# Patient Record
Sex: Female | Born: 1955 | Race: White | Hispanic: No | Marital: Married | Smoking: Never smoker
Health system: Southern US, Community
[De-identification: ages and names within clinical notes are randomized; demographics above are authoritative.]

## PROBLEM LIST (undated history)

## (undated) DIAGNOSIS — Z9889 Other specified postprocedural states: Secondary | ICD-10-CM

## (undated) DIAGNOSIS — T4145XA Adverse effect of unspecified anesthetic, initial encounter: Secondary | ICD-10-CM

## (undated) DIAGNOSIS — R112 Nausea with vomiting, unspecified: Secondary | ICD-10-CM

## (undated) DIAGNOSIS — T8859XA Other complications of anesthesia, initial encounter: Secondary | ICD-10-CM

## (undated) DIAGNOSIS — F419 Anxiety disorder, unspecified: Secondary | ICD-10-CM

## (undated) DIAGNOSIS — J45909 Unspecified asthma, uncomplicated: Secondary | ICD-10-CM

## (undated) HISTORY — DX: Adverse effect of unspecified anesthetic, initial encounter: T41.45XA

## (undated) HISTORY — DX: Other complications of anesthesia, initial encounter: T88.59XA

## (undated) HISTORY — PX: HERNIA REPAIR: SHX51

## (undated) HISTORY — DX: Unspecified asthma, uncomplicated: J45.909

---

## 2008-05-19 ENCOUNTER — Other Ambulatory Visit: Admission: RE | Admit: 2008-05-19 | Discharge: 2008-05-19 | Payer: Self-pay | Admitting: Obstetrics and Gynecology

## 2010-02-20 LAB — HM DEXA SCAN: HM Dexa Scan: NORMAL

## 2010-03-03 ENCOUNTER — Ambulatory Visit
Admission: RE | Admit: 2010-03-03 | Discharge: 2010-03-03 | Payer: Self-pay | Source: Home / Self Care | Attending: Internal Medicine | Admitting: Internal Medicine

## 2010-03-03 DIAGNOSIS — F411 Generalized anxiety disorder: Secondary | ICD-10-CM | POA: Insufficient documentation

## 2010-03-21 ENCOUNTER — Telehealth (INDEPENDENT_AMBULATORY_CARE_PROVIDER_SITE_OTHER): Payer: Self-pay | Admitting: *Deleted

## 2010-03-24 NOTE — Assessment & Plan Note (Signed)
Summary: new acute//congestion//lch   Vital Signs:  Patient profile:   55 year old female Height:      60.5 inches Weight:      122.25 pounds BMI:     23.57 Temp:     98.1 degrees F oral Pulse rate:   62 / minute Pulse rhythm:   regular BP sitting:   112 / 70  (left arm) Cuff size:   regular  Vitals Entered By: Army Fossa CMA (March 03, 2010 11:22 AM) CC: New to establsih, head and chest congestion  Comments mucus is green  started the day after christmas [facility]    History of Present Illness: new patient Symptoms started around Christmas Cough, chest and sinus congestion. Some green sputum. robitussin  helps some, Benadryl did not help  Preventive Screening-Counseling & Management  Alcohol-Tobacco     Smoking Status: never  Caffeine-Diet-Exercise     Does Patient Exercise: yes      Drug Use:  no.    Allergies (verified): 1)  ! Demerol  Past History:  Family History: Last updated: 03/03/2010 Heart disease  Family History Osteoporosis Family History of Stroke F 1st degree relative <60  Social History: Last updated: 03/03/2010 Married Never Smoked Alcohol use-no Drug use-no Regular exercise-yes  Risk Factors: Exercise: yes (03/03/2010)  Risk Factors: Smoking Status: never (03/03/2010)  Past Medical History: Anxiety BC-- husband vasectomy   Past Surgical History: L hernia repair  Csection x 2   Family History: Heart disease  Family History Osteoporosis Family History of Stroke F 1st degree relative <60  Social History: Married Never Smoked Alcohol use-no Drug use-no Regular exercise-yes Smoking Status:  never Drug Use:  no Does Patient Exercise:  yes  Review of Systems General:  Denies fever. Resp:  Denies coughing up blood, shortness of breath, and wheezing. GI:  Denies nausea and vomiting. MS:  Denies muscle aches.  Physical Exam  General:  alert, well-developed, and well-nourished.  no apparent  distress Head:  face symmetric, not tender to palpation Ears:  R ear normal and L ear normal.   Nose:  no congested Mouth:  no redness or discharge Lungs:  normal respiratory effort, no intercostal retractions, no accessory muscle use, and normal breath sounds.   Heart:  normal rate, regular rhythm, no murmur, and no gallop.     Impression & Recommendations:  Problem # 1:  BRONCHITIS- ACUTE (ICD-466.0) symptoms consistent with a URI now with cough and sputum production see instructions  Her updated medication list for this problem includes:    Zithromax Z-pak 250 Mg Tabs (Azithromycin) .Marland Kitchen... As directed  Complete Medication List: 1)  Zoloft 25 Mg Tabs (Sertraline hcl) .... Qd 2)  Zithromax Z-pak 250 Mg Tabs (Azithromycin) .... As directed  Patient Instructions: 1)  rest- fluids 2)  mucinex DM twice a day as needed for cough 3)  sudafed 30mg  behind the counter : 4 times a day as needed for congestion 4)  zpack 5)  call if no better in few days, call if symptoms worsen  Prescriptions: ZITHROMAX Z-PAK 250 MG TABS (AZITHROMYCIN) as directed  #1 x 0   Entered and Authorized by:   Nolon Rod. Joell Usman MD   Signed by:   Nolon Rod. Elzie Knisley MD on 03/03/2010   Method used:   Print then Give to Patient   RxID:   [id number]    Orders Added: 1)  New Patient Level II [99202]     Preventive Care Screening  Mammogram:  Date:  01/20/2010    Results:  normal-per pt   Pap Smear:    Date:  11/20/2009    Results:  normal-per pt   Last Tetanus Booster:    Date:  02/21/2004    Results:  Historical

## 2010-03-30 NOTE — Progress Notes (Signed)
Summary: Still having syptoms  Phone Note Call from Patient Call back at [phone]   Caller: Patient Summary of Call: Pt was given ZPak at last ov for congestion. Pt is still having symptoms. Please advise if something can be called in for her or does she need an OV. Initial call taken by: Lavell Islam,  March 21, 2010 9:05 AM  Follow-up for Phone Call        call in: Flonase--2 sprays on each side of the nose daily for a month Over-the-counter Claritin 10 mg one daily if no improving in the next 10 days ----> OV Follow-up by: Nolon Rod. Paz MD,  March 21, 2010 12:04 PM  Additional Follow-up for Phone Call Additional follow up Details #1::        pt is calling again. please contact her asap Additional Follow-up by: Lavell Islam,  March 21, 2010 1:48 PM    Additional Follow-up for Phone Call Additional follow up Details #2::    Pt aware Rx sent to pharmacy, and OVINB.............Marland KitchenFelecia Deloach CMA  March 21, 2010 2:56 PM   New/Updated Medications: FLONASE 50 MCG/ACT SUSP (FLUTICASONE PROPIONATE) 2 sprays on each side of the nose daily for a month Prescriptions: FLONASE 50 MCG/ACT SUSP (FLUTICASONE PROPIONATE) 2 sprays on each side of the nose daily for a month  #1 x 0   Entered by:   Jeremy Johann CMA   Authorized by:   Nolon Rod. Paz MD   Signed by:   Jeremy Johann CMA on 03/21/2010   Method used:   Faxed to ...       [facility] (retail)       [address]       [place], [place]  [address]       Ph: [phone]       Fax: [phone]   RxID:   [id number]

## 2011-04-13 ENCOUNTER — Encounter (HOSPITAL_COMMUNITY): Payer: Self-pay | Admitting: Pharmacist

## 2011-04-21 HISTORY — PX: ABDOMINAL HYSTERECTOMY: SHX81

## 2011-04-24 ENCOUNTER — Other Ambulatory Visit: Payer: Self-pay | Admitting: Obstetrics and Gynecology

## 2011-04-26 ENCOUNTER — Encounter (HOSPITAL_COMMUNITY): Payer: Self-pay

## 2011-04-26 ENCOUNTER — Encounter (HOSPITAL_COMMUNITY)
Admission: RE | Admit: 2011-04-26 | Discharge: 2011-04-26 | Disposition: A | Discharge status: Home / Self Care | Payer: No Typology Code available for payment source | Source: Ambulatory Visit | Attending: Obstetrics and Gynecology | Admitting: Obstetrics and Gynecology

## 2011-04-26 HISTORY — DX: Anxiety disorder, unspecified: F41.9

## 2011-04-26 HISTORY — DX: Other specified postprocedural states: Z98.890

## 2011-04-26 HISTORY — DX: Other specified postprocedural states: R11.2

## 2011-04-26 LAB — BASIC METABOLIC PANEL
BUN: 15 mg/dL (ref 6–23)
Chloride: 103 mEq/L (ref 96–112)
GFR calc Af Amer: >90 mL/min (ref >90)
Glucose, Bld: 93 mg/dL (ref 70–99)
Potassium: 4.9 mEq/L (ref 3.5–5.1)
Sodium: 140 mEq/L (ref 135–145)

## 2011-04-26 LAB — SURGICAL PCR SCREEN: MRSA, PCR: NEGATIVE

## 2011-04-26 NOTE — Patient Instructions (Signed)
YOUR PROCEDURE IS SCHEDULED ON:05/01/11  ENTER THROUGH THE MAIN ENTRANCE OF [facility] AT:11am  USE DESK PHONE AND DIAL [phone] TO INFORM us OF YOUR ARRIVAL  CALL [phone] IF YOU HAVE ANY QUESTIONS OR PROBLEMS PRIOR TO YOUR ARRIVAL.  REMEMBER: DO NOT EAT AFTER MIDNIGHT :Sunday  SPECIAL INSTRUCTIONS:clear liquids until 0830 am Monday   YOU MAY BRUSH YOUR TEETH THE MORNING OF SURGERY   TAKE THESE MEDICINES THE DAY OF SURGERY WITH SIP OF WATER:allergy med , Zoloft 05/01/11  DO NOT WEAR JEWELRY, EYE MAKEUP, LIPSTICK OR DARK FINGERNAIL POLISH DO NOT WEAR LOTIONS  DO NOT SHAVE FOR 48 HOURS PRIOR TO SURGERY  YOU WILL NOT BE ALLOWED TO DRIVE YOURSELF HOME.  NAME OF DRIVER: Sam - spouse

## 2011-04-30 MED ORDER — DEXTROSE 5 % IV SOLN
1.0000 g | INTRAVENOUS | Status: AC
  Administered 2011-05-01: 1 g via INTRAVENOUS
  Filled 2011-04-30: qty 1

## 2011-05-01 ENCOUNTER — Encounter (HOSPITAL_COMMUNITY): Payer: Self-pay | Admitting: *Deleted

## 2011-05-01 ENCOUNTER — Encounter (HOSPITAL_COMMUNITY)
Admission: RE | Disposition: A | Discharge status: Home / Self Care | Payer: Self-pay | Source: Ambulatory Visit | Attending: Obstetrics and Gynecology

## 2011-05-01 ENCOUNTER — Inpatient Hospital Stay (HOSPITAL_COMMUNITY): Payer: No Typology Code available for payment source

## 2011-05-01 ENCOUNTER — Encounter (HOSPITAL_COMMUNITY): Payer: Self-pay

## 2011-05-01 ENCOUNTER — Ambulatory Visit (HOSPITAL_COMMUNITY)
Admission: RE | Admit: 2011-05-01 | Discharge: 2011-05-02 | Disposition: A | Discharge status: Home / Self Care | Payer: No Typology Code available for payment source | Source: Ambulatory Visit | Attending: Obstetrics and Gynecology | Admitting: Obstetrics and Gynecology

## 2011-05-01 DIAGNOSIS — N950 Postmenopausal bleeding: Secondary | ICD-10-CM | POA: Insufficient documentation

## 2011-05-01 DIAGNOSIS — Z01812 Encounter for preprocedural laboratory examination: Secondary | ICD-10-CM | POA: Insufficient documentation

## 2011-05-01 DIAGNOSIS — D250 Submucous leiomyoma of uterus: Secondary | ICD-10-CM | POA: Insufficient documentation

## 2011-05-01 DIAGNOSIS — Z01818 Encounter for other preprocedural examination: Secondary | ICD-10-CM | POA: Insufficient documentation

## 2011-05-01 DIAGNOSIS — Z90710 Acquired absence of both cervix and uterus: Secondary | ICD-10-CM

## 2011-05-01 DIAGNOSIS — Z79890 Hormone replacement therapy (postmenopausal): Secondary | ICD-10-CM | POA: Insufficient documentation

## 2011-05-01 DIAGNOSIS — F411 Generalized anxiety disorder: Secondary | ICD-10-CM

## 2011-05-01 HISTORY — PX: SALPINGOOPHORECTOMY: SHX82

## 2011-05-01 LAB — CBC
MCH: 30.5 pg (ref 26.0–34.0)
MCHC: 34.5 g/dL (ref 30.0–36.0)
MCV: 88.5 fL (ref 78.0–100.0)
Platelets: 185 10*3/uL (ref 150–400)
RDW: 12.3 % (ref 11.5–15.5)

## 2011-05-01 SURGERY — ROBOTIC ASSISTED TOTAL HYSTERECTOMY
Anesthesia: General

## 2011-05-01 MED ORDER — KETOROLAC TROMETHAMINE 30 MG/ML IJ SOLN
30.0000 mg | Freq: Once | INTRAMUSCULAR | Status: AC
  Administered 2011-05-01: 30 mg via INTRAVENOUS

## 2011-05-01 MED ORDER — ONDANSETRON HCL 4 MG/2ML IJ SOLN
INTRAMUSCULAR | Status: AC
  Filled 2011-05-01: qty 2

## 2011-05-01 MED ORDER — LIDOCAINE HCL (CARDIAC) 20 MG/ML IV SOLN
INTRAVENOUS | Status: DC | PRN
  Administered 2011-05-01: 60 mg via INTRAVENOUS

## 2011-05-01 MED ORDER — DEXAMETHASONE SODIUM PHOSPHATE 10 MG/ML IJ SOLN
INTRAMUSCULAR | Status: DC | PRN
  Administered 2011-05-01: 10 mg via INTRAVENOUS

## 2011-05-01 MED ORDER — ACETAMINOPHEN 325 MG PO TABS: 325.0000 mg | ORAL_TABLET | ORAL | Status: DC | PRN

## 2011-05-01 MED ORDER — PROPOFOL 10 MG/ML IV EMUL
INTRAVENOUS | Status: AC
  Filled 2011-05-01: qty 20

## 2011-05-01 MED ORDER — GLYCOPYRROLATE 0.2 MG/ML IJ SOLN
INTRAMUSCULAR | Status: AC
  Filled 2011-05-01: qty 3

## 2011-05-01 MED ORDER — KETOROLAC TROMETHAMINE 30 MG/ML IJ SOLN
INTRAMUSCULAR | Status: AC
  Filled 2011-05-01: qty 1

## 2011-05-01 MED ORDER — NEOSTIGMINE METHYLSULFATE 1 MG/ML IJ SOLN
INTRAMUSCULAR | Status: AC
  Filled 2011-05-01: qty 10

## 2011-05-01 MED ORDER — ROCURONIUM BROMIDE 100 MG/10ML IV SOLN
INTRAVENOUS | Status: DC | PRN
  Administered 2011-05-01: 10 mg via INTRAVENOUS
  Administered 2011-05-01: 50 mg via INTRAVENOUS
  Administered 2011-05-01 (×2): 20 mg via INTRAVENOUS

## 2011-05-01 MED ORDER — IBUPROFEN 600 MG PO TABS
600.0000 mg | ORAL_TABLET | Freq: Four times a day (QID) | ORAL | Status: DC | PRN
  Administered 2011-05-02: 600 mg via ORAL
  Filled 2011-05-01: qty 1

## 2011-05-01 MED ORDER — SCOPOLAMINE 1 MG/3DAYS TD PT72
MEDICATED_PATCH | TRANSDERMAL | Status: AC
  Administered 2011-05-01: 1.5 mg via TRANSDERMAL
  Filled 2011-05-01: qty 1

## 2011-05-01 MED ORDER — FENTANYL CITRATE 0.05 MG/ML IJ SOLN
INTRAMUSCULAR | Status: DC | PRN
  Administered 2011-05-01: 100 ug via INTRAVENOUS
  Administered 2011-05-01 (×3): 50 ug via INTRAVENOUS

## 2011-05-01 MED ORDER — ROPIVACAINE HCL 5 MG/ML IJ SOLN
INTRAMUSCULAR | Status: DC | PRN
  Administered 2011-05-01: 30 mL via EPIDURAL

## 2011-05-01 MED ORDER — DEXAMETHASONE SODIUM PHOSPHATE 10 MG/ML IJ SOLN
INTRAMUSCULAR | Status: AC
  Filled 2011-05-01: qty 1

## 2011-05-01 MED ORDER — LACTATED RINGERS IV SOLN
INTRAVENOUS | Status: DC
  Administered 2011-05-01 – 2011-05-02 (×2): via INTRAVENOUS

## 2011-05-01 MED ORDER — FENTANYL CITRATE 0.05 MG/ML IJ SOLN: 25.0000 ug | INTRAMUSCULAR | Status: DC | PRN

## 2011-05-01 MED ORDER — SCOPOLAMINE 1 MG/3DAYS TD PT72
1.0000 | MEDICATED_PATCH | TRANSDERMAL | Status: DC
  Administered 2011-05-01: 1.5 mg via TRANSDERMAL

## 2011-05-01 MED ORDER — ROPIVACAINE HCL 5 MG/ML IJ SOLN
INTRAMUSCULAR | Status: AC
  Filled 2011-05-01: qty 60

## 2011-05-01 MED ORDER — MIDAZOLAM HCL 2 MG/2ML IJ SOLN
INTRAMUSCULAR | Status: AC
  Filled 2011-05-01: qty 2

## 2011-05-01 MED ORDER — OXYCODONE-ACETAMINOPHEN 5-325 MG PO TABS: 1.0000 | ORAL_TABLET | ORAL | Status: DC | PRN

## 2011-05-01 MED ORDER — EPHEDRINE SULFATE 50 MG/ML IJ SOLN
INTRAMUSCULAR | Status: DC | PRN
  Administered 2011-05-01: 5 mg via INTRAVENOUS
  Administered 2011-05-01 (×2): 10 mg via INTRAVENOUS

## 2011-05-01 MED ORDER — LIDOCAINE HCL (CARDIAC) 20 MG/ML IV SOLN
INTRAVENOUS | Status: AC
  Filled 2011-05-01: qty 5

## 2011-05-01 MED ORDER — ONDANSETRON HCL 4 MG/2ML IJ SOLN
INTRAMUSCULAR | Status: DC | PRN
  Administered 2011-05-01: 4 mg via INTRAVENOUS

## 2011-05-01 MED ORDER — PROPOFOL 10 MG/ML IV EMUL
INTRAVENOUS | Status: DC | PRN
  Administered 2011-05-01: 150 mg via INTRAVENOUS

## 2011-05-01 MED ORDER — LACTATED RINGERS IR SOLN
Status: DC | PRN
  Administered 2011-05-01: 3000 mL

## 2011-05-01 MED ORDER — ROCURONIUM BROMIDE 50 MG/5ML IV SOLN
INTRAVENOUS | Status: AC
  Filled 2011-05-01: qty 1

## 2011-05-01 MED ORDER — FENTANYL CITRATE 0.05 MG/ML IJ SOLN
INTRAMUSCULAR | Status: AC
  Filled 2011-05-01: qty 5

## 2011-05-01 MED ORDER — NEOSTIGMINE METHYLSULFATE 1 MG/ML IJ SOLN
INTRAMUSCULAR | Status: DC | PRN
  Administered 2011-05-01: 3 mg via INTRAVENOUS

## 2011-05-01 MED ORDER — ARTIFICIAL TEARS OP OINT
TOPICAL_OINTMENT | OPHTHALMIC | Status: DC | PRN
  Administered 2011-05-01: 1 via OPHTHALMIC

## 2011-05-01 MED ORDER — GLYCOPYRROLATE 0.2 MG/ML IJ SOLN
INTRAMUSCULAR | Status: DC | PRN
  Administered 2011-05-01: 0.1 mg via INTRAVENOUS
  Administered 2011-05-01: 0.6 mg via INTRAVENOUS

## 2011-05-01 MED ORDER — KETOROLAC TROMETHAMINE 30 MG/ML IJ SOLN: 15.0000 mg | Freq: Once | INTRAMUSCULAR | Status: DC | PRN

## 2011-05-01 MED ORDER — MIDAZOLAM HCL 5 MG/5ML IJ SOLN
INTRAMUSCULAR | Status: DC | PRN
  Administered 2011-05-01: 2 mg via INTRAVENOUS

## 2011-05-01 MED ORDER — HYDROMORPHONE HCL PF 1 MG/ML IJ SOLN
INTRAMUSCULAR | Status: AC
  Filled 2011-05-01: qty 1

## 2011-05-01 MED ORDER — STERILE WATER FOR IRRIGATION IR SOLN
Status: DC | PRN
  Administered 2011-05-01: 1000 mL

## 2011-05-01 MED ORDER — LACTATED RINGERS IV SOLN
INTRAVENOUS | Status: DC
  Administered 2011-05-01 (×2): via INTRAVENOUS

## 2011-05-01 MED ORDER — EPHEDRINE 5 MG/ML INJ
INTRAVENOUS | Status: AC
  Filled 2011-05-01: qty 10

## 2011-05-01 MED ORDER — ROCURONIUM BROMIDE 50 MG/5ML IV SOLN
INTRAVENOUS | Status: AC
  Filled 2011-05-01: qty 2

## 2011-05-01 MED ORDER — HYDROMORPHONE HCL PF 1 MG/ML IJ SOLN
INTRAMUSCULAR | Status: DC | PRN
  Administered 2011-05-01: 1 mg via INTRAVENOUS

## 2011-05-01 SURGICAL SUPPLY — 70 items
BAG URINE DRAINAGE (UROLOGICAL SUPPLIES) ×3 IMPLANT
BARRIER ADHS 3X4 INTERCEED (GAUZE/BANDAGES/DRESSINGS) ×3 IMPLANT
BENZOIN TINCTURE PRP APPL 2/3 (GAUZE/BANDAGES/DRESSINGS) ×3 IMPLANT
CABLE HIGH FREQUENCY MONO STRZ (ELECTRODE) ×3 IMPLANT
CATH FOLEY 3WAY  5CC 16FR (CATHETERS) ×1
CATH FOLEY 3WAY 5CC 16FR (CATHETERS) ×2 IMPLANT
CONT PATH 16OZ SNAP LID 3702 (MISCELLANEOUS) ×3 IMPLANT
COVER MAYO STAND STRL (DRAPES) ×3 IMPLANT
COVER TABLE BACK 60X90 (DRAPES) ×6 IMPLANT
COVER TIP SHEARS 8 DVNC (MISCELLANEOUS) ×2 IMPLANT
COVER TIP SHEARS 8MM DA VINCI (MISCELLANEOUS) ×1
DECANTER SPIKE VIAL GLASS SM (MISCELLANEOUS) ×3 IMPLANT
DRAPE HUG U DISPOSABLE (DRAPE) ×3 IMPLANT
DRAPE LG THREE QUARTER DISP (DRAPES) ×6 IMPLANT
DRAPE MONITOR DA VINCI (DRAPE) IMPLANT
DRAPE WARM FLUID 44X44 (DRAPE) ×3 IMPLANT
ELECT REM PT RETURN 9FT ADLT (ELECTROSURGICAL) ×3
ELECTRODE REM PT RTRN 9FT ADLT (ELECTROSURGICAL) ×2 IMPLANT
EVACUATOR SMOKE 8.L (FILTER) ×3 IMPLANT
GAUZE VASELINE 3X9 (GAUZE/BANDAGES/DRESSINGS) IMPLANT
GLOVE BIO SURGEON STRL SZ 6.5 (GLOVE) ×9 IMPLANT
GLOVE BIOGEL M 6.5 STRL (GLOVE) ×6 IMPLANT
GLOVE BIOGEL PI IND STRL 6.5 (GLOVE) ×4 IMPLANT
GLOVE BIOGEL PI IND STRL 7.0 (GLOVE) ×8 IMPLANT
GLOVE BIOGEL PI INDICATOR 6.5 (GLOVE) ×2
GLOVE BIOGEL PI INDICATOR 7.0 (GLOVE) ×4
GLOVE ECLIPSE 6.5 STRL STRAW (GLOVE) ×12 IMPLANT
GOWN STRL REIN XL XLG (GOWN DISPOSABLE) ×18 IMPLANT
GYRUS RUMI II 2.5CM BLUE (DISPOSABLE)
GYRUS RUMI II 3.5CM BLUE (DISPOSABLE)
GYRUS RUMI II 4.0CM BLUE (DISPOSABLE)
KIT ACCESSORY DA VINCI DISP (KITS) ×1
KIT ACCESSORY DVNC DISP (KITS) ×2 IMPLANT
KIT DISP ACCESSORY 4 ARM (KITS) IMPLANT
NEEDLE INSUFFLATION 14GA 120MM (NEEDLE) ×3 IMPLANT
NS IRRIG 1000ML POUR BTL (IV SOLUTION) ×9 IMPLANT
OCCLUDER COLPOPNEUMO (BALLOONS) IMPLANT
PACK LAVH (CUSTOM PROCEDURE TRAY) ×3 IMPLANT
PAD PREP 24X48 CUFFED NSTRL (MISCELLANEOUS) ×6 IMPLANT
PLUG CATH AND CAP STER (CATHETERS) ×3 IMPLANT
PROTECTOR NERVE ULNAR (MISCELLANEOUS) ×6 IMPLANT
RUMI II 3.0CM BLUE KOH-EFFICIE (DISPOSABLE) IMPLANT
RUMI II GYRUS 2.5CM BLUE (DISPOSABLE) IMPLANT
RUMI II GYRUS 3.5CM BLUE (DISPOSABLE) IMPLANT
RUMI II GYRUS 4.0CM BLUE (DISPOSABLE) IMPLANT
SET CYSTO W/LG BORE CLAMP LF (SET/KITS/TRAYS/PACK) ×3 IMPLANT
SET IRRIG TUBING LAPAROSCOPIC (IRRIGATION / IRRIGATOR) ×3 IMPLANT
SOLUTION ELECTROLUBE (MISCELLANEOUS) ×3 IMPLANT
SPONGE LAP 18X18 X RAY DECT (DISPOSABLE) IMPLANT
STRIP CLOSURE SKIN 1/4X4 (GAUZE/BANDAGES/DRESSINGS) ×3 IMPLANT
SUT VIC AB 0 CT1 27 (SUTURE) ×6
SUT VIC AB 0 CT1 27XBRD ANBCTR (SUTURE) ×2 IMPLANT
SUT VIC AB 0 CT1 27XBRD ANTBC (SUTURE) ×10 IMPLANT
SUT VICRYL 0 UR6 27IN ABS (SUTURE) ×3 IMPLANT
SUT VICRYL RAPIDE 4/0 PS 2 (SUTURE) ×15 IMPLANT
SYR 30ML LL (SYRINGE) ×3 IMPLANT
SYR 50ML LL SCALE MARK (SYRINGE) ×3 IMPLANT
SYSTEM CONVERTIBLE TROCAR (TROCAR) IMPLANT
TIP UTERINE 5.1X6CM LAV DISP (MISCELLANEOUS) IMPLANT
TIP UTERINE 6.7X10CM GRN DISP (MISCELLANEOUS) IMPLANT
TIP UTERINE 6.7X6CM WHT DISP (MISCELLANEOUS) IMPLANT
TIP UTERINE 6.7X8CM BLUE DISP (MISCELLANEOUS) IMPLANT
TOWEL OR 17X24 6PK STRL BLUE (TOWEL DISPOSABLE) ×9 IMPLANT
TROCAR 12M 150ML BLUNT (TROCAR) IMPLANT
TROCAR DISP BLADELESS 8 DVNC (TROCAR) ×2 IMPLANT
TROCAR DISP BLADELESS 8MM (TROCAR) ×1
TROCAR XCEL 12X100 BLDLESS (ENDOMECHANICALS) IMPLANT
TROCAR XCEL NON-BLD 5MMX100MML (ENDOMECHANICALS) ×3 IMPLANT
TUBING FILTER THERMOFLATOR (ELECTROSURGICAL) ×3 IMPLANT
WARMER LAPAROSCOPE (MISCELLANEOUS) ×3 IMPLANT

## 2011-05-01 NOTE — Transfer of Care (Signed)
Immediate Anesthesia Transfer of Care Note  Patient: Tammy Long  Procedure(s) Performed: Procedure(s) (LRB): ROBOTIC ASSISTED TOTAL HYSTERECTOMY (N/A) SALPINGO OOPHERECTOMY (Bilateral)  Patient Location: PACU  Anesthesia Type: General  Level of Consciousness: awake, alert  and oriented  Airway & Oxygen Therapy: Patient Spontanous Breathing and Patient connected to nasal cannula oxygen  Post-op Assessment: Report given to PACU RN and Post -op Vital signs reviewed and stable  Post vital signs: Reviewed and stable  Complications: No apparent anesthesia complications

## 2011-05-01 NOTE — Anesthesia Postprocedure Evaluation (Signed)
Anesthesia Post Note  Patient: Tammy Long  Procedure(s) Performed: Procedure(s) (LRB): ROBOTIC ASSISTED TOTAL HYSTERECTOMY (N/A) SALPINGO OOPHERECTOMY (Bilateral)  Anesthesia type: GA  Patient location: PACU  Post pain: Pain level controlled  Post assessment: Post-op Vital signs reviewed  Last Vitals:  Filed Vitals:   05/01/11 1745  BP: 117/55  Pulse: 97  Temp:   Resp: 5    Post vital signs: Reviewed  Level of consciousness: sedated  Complications: No apparent anesthesia complications

## 2011-05-01 NOTE — H&P (Signed)
Date of Initial H&P: 04/04/2011  History reviewed, patient examined, no change in status, stable for surgery.

## 2011-05-01 NOTE — Anesthesia Procedure Notes (Signed)
Procedure Name: Intubation Date/Time: 05/01/2011 12:56 PM Performed by: Graciela Husbands Pre-anesthesia Checklist: Suction available, Emergency Drugs available, Timeout performed, Patient identified and Patient being monitored Patient Re-evaluated:Patient Re-evaluated prior to inductionOxygen Delivery Method: Circle system utilized Preoxygenation: Pre-oxygenation with 100% oxygen Intubation Type: IV induction Ventilation: Mask ventilation without difficulty Laryngoscope Size: Mac and 3 Grade View: Grade I Tube type: Oral Tube size: 7.0 mm Number of attempts: 1 Airway Equipment and Method: Stylet Placement Confirmation: ETT inserted through vocal cords under direct vision,  breath sounds checked- equal and bilateral and positive ETCO2 Secured at: 21 cm Tube secured with: Tape Dental Injury: Teeth and Oropharynx as per pre-operative assessment

## 2011-05-01 NOTE — Anesthesia Preprocedure Evaluation (Signed)
Anesthesia Evaluation  Patient identified by MRN, date of birth, ID band Patient awake    Reviewed: Allergy & Precautions, H&P , Patient's Chart, lab work & pertinent test results, reviewed documented beta blocker date and time   History of Anesthesia Complications (+) PONV  Airway Mallampati: II TM Distance: >3 FB Neck ROM: full    Dental No notable dental hx.    Pulmonary neg pulmonary ROS,  breath sounds clear to auscultation  Pulmonary exam normal       Cardiovascular Exercise Tolerance: Good negative cardio ROS  Rhythm:regular Rate:Normal     Neuro/Psych PSYCHIATRIC DISORDERS Anxiety negative neurological ROS  negative psych ROS   GI/Hepatic negative GI ROS, Neg liver ROS,   Endo/Other  negative endocrine ROS  Renal/GU negative Renal ROS     Musculoskeletal   Abdominal   Peds  Hematology negative hematology ROS (+)   Anesthesia Other Findings   Reproductive/Obstetrics negative OB ROS                           Anesthesia Physical Anesthesia Plan  ASA: II  Anesthesia Plan: General ETT   Post-op Pain Management:    Induction:   Airway Management Planned:   Additional Equipment:   Intra-op Plan:   Post-operative Plan:   Informed Consent: I have reviewed the patients History and Physical, chart, labs and discussed the procedure including the risks, benefits and alternatives for the proposed anesthesia with the patient or authorized representative who has indicated his/her understanding and acceptance.   Dental Advisory Given  Plan Discussed with: CRNA and Surgeon  Anesthesia Plan Comments:         Anesthesia Quick Evaluation

## 2011-05-01 NOTE — Op Note (Signed)
05/01/2011  5:56 PM  PATIENT:  Tammy Long  56 y.o. female  PRE-OPERATIVE DIAGNOSIS:  Postmenopausal Bleeding; on Hormone Replacement Therapy; Submucosal Fibroid  POST-OPERATIVE DIAGNOSIS:  Postmenopausal Bleeding; on Hormone Replacement Therapy; Submucosal Fibroid  PROCEDURE:  Procedure(s) (LRB): ROBOTIC ASSISTED TOTAL HYSTERECTOMY (N/A) SALPINGO OOPHERECTOMY (Bilateral)  SURGEON:  Surgeon(s) and Role:    * Khalfani Weideman J. Richardson Dopp, MD - Primary    * Geryl Rankins, MD - Assisting    * Alison Murray, MD  PHYSICIAN ASSISTANT:   ASSISTANTS: Dr. Geryl Rankins    ANESTHESIA:   general  EBL: 100   Total I/O In: 2000 [I.V.:2000] Out: 400 [Urine:300; Blood:100]  BLOOD ADMINISTERED:none  DRAINS: Urinary Catheter (Foley)   LOCAL MEDICATIONS USED:  OTHER ropivacaine protocol  Findings: 10 wk size uterus .Marland Kitchen Dense adhesions of the bladder to the lower uterine segment and cervix. Normal fallopian tubs and ovaries. Adhesion of the omentum to the anterior abdominal wall.   SPECIMEN:  Source of Specimen:  Uterus Cervix  bilateral fallopian tubes and ovaries   DISPOSITION OF SPECIMEN:  PATHOLOGY  COUNTS:  YES  TOURNIQUET:  * No tourniquets in log *  DICTATION: .Other Dictation: Dictation Number   PLAN OF CARE: Admit for overnight observation  PATIENT DISPOSITION:  PACU - hemodynamically stable.   Delay start of Pharmacological VTE agent (>24hrs) due to surgical blood loss or risk of bleeding: not applicable   Procedure: the patient was taken to the operating room where general anesthesia was obtained. The patient was then examined under anesthesia and found to have a 10 wk size uterus. She was placed in the dorsal lithotomy position and prepped and draped in a sterile fashion. A weighted speculum was placed into the vagina. A dever was placed anteriorly for retraction. The anterior lip of the cervix was grasped with a single tooth tenaculum. The vagina mucosa was injected with 2.5  ml of Ropivacaine at the 10, 2, 4 and 8 o'clock positions. The uterus was sounded to 9 cm. The cervix was dilated to 6 mm. A suture was placed at the 12 and 6 o'clock positions of the cervix to facilitate placement of the RUMI uterine  manipulator. The Rumi manipulator was placed without difficulty. The weighted speculum and dever were removed. The vaginal occluder was insufflated.    Attention was turned to the patients abdomen where a 12 mm skin incision was made approximately 2 cm above the umbilicus. The veres needle was carefully introduced into the peritoneal cavity while tenting the abdominal wall. Intraperitoneal placement was confirmed by use of a water-filled syringe and drop in intraabdominal pressure with insufflation of CO2 gas. The veres needle was removed and a 12 mm trocar was advanced into the abdomen Intraabdominal placement was confirmed with the laparoscope. The laparoscope was removed and 60 cc of Ropivacaine was injected into the abdominal cavity. The laparoscope was then reinserted. At this point all trocar sites were determined , marked,  Injected with 10 cc of ropivacaine and placed under direct visualization. . A 12 mm skin incision was made in the upper left quadrant at the mid clavicular position.  A 12 mm accessory port was placed in the upper left quadrant at the mid clavicular position under direct visualization. An 8 mm trocar was placed in the later left upper quadrant. And later connected to robotic arm #2. Attention was turned to the right upper quadrant where an 8 mm mid clavicular trocar (later connected to  robotic Arm #1) was placed.  Once all ports were placed under direct visualization, the laparoscope was removed and  the DaVinci Robotic system was then right side docked.The arms were connected to the corresponding trocars as listed above. The laparoscope was then reinserted. The PK bipolar cautery was placed into port #1. The Monopolar scissors were placed into port #2.  All instruments were directed into the pelvis under direct visualization.    Attention was turned to the surgeons console. An adhesion of the omentum to the anterior abdominal wall was excised using monopolar scissors. The ureters were identified bilaterally. The right infundibulopelvic ligament was cauterized with the PK and excised with scissors. The broad ligament was cauterized with the PK and excised with scissors. The round ligament was cauterized with the PK and excised with scissors. Dense adhesions of the bladder to the uterus and cervix were noted.  The  anterior leaf of the broad ligament was carefully  incised along the bladder reflection to the midline.    The left infundibulopelvic  ligament was cauterized with the PK and excised with scissors. The left broad ligament was cauterized with the PK and excised with scissors. The left  round ligament was cauterized with the PK and excised with scissors. The anterior leaf of the broad ligament was incised along the bladder reflection to the midline. The bladder was filled with 300 cc of saline to facilitate demarcation of an appropriate plane. The bladder was dissected off the lower uterine segment and the cervix via sharp and blunt dissection.   The uterine arteries were skeltonized bilaterally. They were cauterized with the PK and transected. The KOH ring was  Identified. The anterior colpotomy was performed followed by the posterior colpotomy. The uterus and specimen were withdrawn into the vagina and sent to pathology. The PK and scissors were removed. Long tip forceps were placed into port #1 and cutting needle driver was placed into port #2. The vaginal cuff angles were closed with figure of eight stitches of 0 vicryl. The remainder of the vaginal cuff was closed interrupted 0 vicryl figure- of -eight sutures. The pelvis was irrigated using the accessory port. Excellent hemostasis was noted.    All pedicles were examined and hemostasis was  noted. The ureters were identified bilaterally and noted to periostalis. Interceed was placed along the vaginal cuff. All instruments were removed from there ports. All ports were removed under direct visualization.The pneumopertonium was released.  The fascia of the 12 mm ports were closed with 0 vicryl. The skin incisions were closed with 4-0 vicryl  And derma bond.   Sponge lap and needle counts were correct x 2. The patient was awakened from anesthesia and taken to the recovery room in stable condition.

## 2011-05-02 ENCOUNTER — Encounter (HOSPITAL_COMMUNITY): Payer: Self-pay | Admitting: Anesthesiology

## 2011-05-02 ENCOUNTER — Encounter (HOSPITAL_COMMUNITY): Payer: Self-pay | Admitting: Obstetrics and Gynecology

## 2011-05-02 LAB — CBC
MCH: 30.3 pg (ref 26.0–34.0)
MCHC: 34 g/dL (ref 30.0–36.0)
MCV: 89.2 fL (ref 78.0–100.0)
Platelets: 160 10*3/uL (ref 150–400)
RDW: 12.5 % (ref 11.5–15.5)

## 2011-05-02 MED ORDER — OXYCODONE-ACETAMINOPHEN 5-325 MG PO TABS: 1.0000 | ORAL_TABLET | ORAL | Status: AC | PRN

## 2011-05-02 MED ORDER — IBUPROFEN 600 MG PO TABS: 600.0000 mg | ORAL_TABLET | Freq: Four times a day (QID) | ORAL | Status: AC | PRN

## 2011-05-02 NOTE — Progress Notes (Signed)
Subjective: Patient reports no problems voiding.    Objective: I have reviewed patient's vital signs, intake and output, medications and labs.  General: alert and cooperative GI: soft, non-tender; bowel sounds normal; no masses,  no organomegaly Extremities: extremities normal, atraumatic, no cyanosis or edema   Assessment/Plan: POD #1 s/p Robotic assisted total hysterectomy /BSO Doing well. D/c home follow up in office in 2 wks   LOS: 1 day    Ceriah Long J. 05/02/2011, 9:23 AM

## 2011-05-02 NOTE — Anesthesia Postprocedure Evaluation (Signed)
  Anesthesia Post-op Note  Patient: Tammy Long  Procedure(s) Performed: Procedure(s) (LRB): ROBOTIC ASSISTED TOTAL HYSTERECTOMY (N/A) SALPINGO OOPHERECTOMY (Bilateral)  Patient Location: PACU and Women's Unit  Anesthesia Type: General  Level of Consciousness: awake, alert  and oriented  Airway and Oxygen Therapy: Patient Spontanous Breathing  Post-op Pain: mild  Post-op Assessment: Post-op Vital signs reviewed  Post-op Vital Signs: Reviewed and stable  Complications: No apparent anesthesia complications

## 2012-03-07 ENCOUNTER — Telehealth: Payer: Self-pay | Admitting: Internal Medicine

## 2012-03-07 ENCOUNTER — Encounter: Payer: Self-pay | Admitting: Family Medicine

## 2012-03-07 ENCOUNTER — Ambulatory Visit (INDEPENDENT_AMBULATORY_CARE_PROVIDER_SITE_OTHER): Payer: No Typology Code available for payment source | Admitting: Family Medicine

## 2012-03-07 VITALS — BP 110/74 | HR 69 | Temp 98.4°F | Wt 127.8 lb

## 2012-03-07 DIAGNOSIS — R109 Unspecified abdominal pain: Secondary | ICD-10-CM

## 2012-03-07 DIAGNOSIS — F3289 Other specified depressive episodes: Secondary | ICD-10-CM

## 2012-03-07 DIAGNOSIS — F32A Depression, unspecified: Secondary | ICD-10-CM

## 2012-03-07 DIAGNOSIS — F329 Major depressive disorder, single episode, unspecified: Secondary | ICD-10-CM

## 2012-03-07 LAB — POCT URINALYSIS DIPSTICK
Bilirubin, UA: NEGATIVE
Blood, UA: NEGATIVE
Nitrite, UA: NEGATIVE
Protein, UA: NEGATIVE
Urobilinogen, UA: 0.2
pH, UA: 5

## 2012-03-07 MED ORDER — CYCLOBENZAPRINE HCL 10 MG PO TABS: 10.0000 mg | ORAL_TABLET | Freq: Three times a day (TID) | ORAL | Status: DC | PRN

## 2012-03-07 MED ORDER — SERTRALINE HCL 50 MG PO TABS: 50.0000 mg | ORAL_TABLET | Freq: Every day | ORAL | Status: DC

## 2012-03-07 NOTE — Progress Notes (Signed)
  Subjective:    Tammy Long is a 57 y.o. female who presents for evaluation of low back pain. The patient has had no prior back problems. Symptoms have been present for 1 year and are gradually worsening.  Onset was related to / precipitated by no known injury. The pain is located in the R flank and does not radiate. The pain is described as sharp and occurs on and off all day. She rates her pain as a 7 on a scale of 0-10. Symptoms are exacerbated by twisting. Symptoms are improved by change in body position and rest. She has also tried nothing which provided no symptom relief. She has no other symptoms associated with the back pain. The patient has no "red flag" history indicative of complicated back pain.  Pt states it feels like there is a baseball in her R side.     The following portions of the patient's history were reviewed and updated as appropriate: allergies, current medications, past family history, past medical history, past social history, past surgical history and problem list.  Review of Systems Pertinent items are noted in HPI.    Objective:   Normal reflexes, gait, strength and negative straight-leg raise. Inspection and palpation: inspection of back is normal, pain in R flank area. Muscle tone and ROM exam: muscle tone normal without spasm.    Assessment:    r flank pain   depression-- refill zoloft Plan:    Ice to affected area as needed for local pain relief. Heat to affected area as needed for local pain relief. Muscle relaxants per medication orders. xray  rto prn

## 2012-03-07 NOTE — Patient Instructions (Addendum)
Flank Pain  Flank pain refers to pain that is located on the side of the body between the upper abdomen and the back. It can be caused by many things.  CAUSES   Some of the more common causes of flank pain include:   Muscle strain.   Muscle spasms.   A disease of your spine (vertebral disk disease).   A lung infection (pneumonia).   Fluid around your lungs (pulmonary edema).   A kidney infection.   Kidney stones.   A very painful skin rash on only one side of your body (shingles).   Gallbladder disease.  DIAGNOSIS   Blood tests, urine tests, and X-rays may help your caregiver determine what is wrong.  TREATMENT   The treatment of pain depends on the cause. Your caregiver will determine what treatment will work best for you.  HOME CARE INSTRUCTIONS    Home care will depend on the cause of your pain.   Some medications may help relieve the pain. Take medication for relief of pain as directed by your caregiver.   Tell your caregiver about any changes in your pain.   Follow up with your caregiver.  SEEK IMMEDIATE MEDICAL CARE IF:    Your pain is not controlled with medication.   The pain increases.   You have abdominal pain.   You have shortness of breath.   You have persistent nausea or vomiting.   You have swelling in your abdomen.   You feel faint or pass out.   You have a temperature by mouth above 102 F (38.9 C), not controlled by medicine.  MAKE SURE YOU:    Understand these instructions.   Will watch your condition.   Will get help right away if you are not doing well or get worse.  Document Released: 03/30/2005 Document Revised: 05/01/2011 Document Reviewed: 07/24/2009  Exit[employer] Patient Information 2013 [employer].

## 2012-03-07 NOTE — Telephone Encounter (Signed)
Patient Information:  Caller Name: Kimorah  Phone: [phone]  Patient: Tammy Long  Gender: Female  DOB: November 12, 1955  Age: 57 Years  PCP: Willow Ora  Pregnant: No  Office Follow Up:  Does the office need to follow up with this patient?: No  Instructions For The Office: N/A  RN Note:  Has had the intermittent pains for over a year but has intensified.  Today she had a sharp stabbing pain while doing yoga.  Symptoms  Reason For Call & Symptoms: Having intermittent pain on right side, just above her waist and to the back.  Seems more intense when she is rolling to her left.  Reviewed Health History In EMR: Yes  Reviewed Medications In EMR: Yes  Reviewed Allergies In EMR: Yes  Reviewed Surgeries / Procedures: Yes  Date of Onset of Symptoms: 02/21/2011 OB / GYN:  LMP: Unknown  Guideline(s) Used:  Abdominal Pain - Female  Disposition Per Guideline:   See Today in Office  Reason For Disposition Reached:   Moderate or mild pain that comes and goes (cramps) lasts > 24 hours  Advice Given:  N/A  Appointment Scheduled:  03/07/2012 15:30:00 Appointment Scheduled Provider:  Lelon Perla.

## 2012-03-07 NOTE — Telephone Encounter (Signed)
To see Dr Laury Axon today

## 2012-03-08 ENCOUNTER — Ambulatory Visit (HOSPITAL_BASED_OUTPATIENT_CLINIC_OR_DEPARTMENT_OTHER)
Admission: RE | Admit: 2012-03-08 | Discharge: 2012-03-08 | Disposition: A | Discharge status: Home / Self Care | Payer: No Typology Code available for payment source | Source: Ambulatory Visit | Attending: Family Medicine | Admitting: Family Medicine

## 2012-03-08 DIAGNOSIS — M549 Dorsalgia, unspecified: Secondary | ICD-10-CM | POA: Insufficient documentation

## 2012-03-08 DIAGNOSIS — R109 Unspecified abdominal pain: Secondary | ICD-10-CM

## 2012-04-10 ENCOUNTER — Telehealth: Payer: Self-pay | Admitting: Internal Medicine

## 2012-04-10 ENCOUNTER — Emergency Department (HOSPITAL_BASED_OUTPATIENT_CLINIC_OR_DEPARTMENT_OTHER): Payer: No Typology Code available for payment source

## 2012-04-10 ENCOUNTER — Encounter (HOSPITAL_BASED_OUTPATIENT_CLINIC_OR_DEPARTMENT_OTHER): Payer: Self-pay | Admitting: *Deleted

## 2012-04-10 ENCOUNTER — Emergency Department (HOSPITAL_BASED_OUTPATIENT_CLINIC_OR_DEPARTMENT_OTHER)
Admission: EM | Admit: 2012-04-10 | Discharge: 2012-04-10 | Disposition: A | Discharge status: Home / Self Care | Payer: No Typology Code available for payment source | Attending: Emergency Medicine | Admitting: Emergency Medicine

## 2012-04-10 DIAGNOSIS — J45901 Unspecified asthma with (acute) exacerbation: Secondary | ICD-10-CM | POA: Insufficient documentation

## 2012-04-10 DIAGNOSIS — Z79899 Other long term (current) drug therapy: Secondary | ICD-10-CM | POA: Insufficient documentation

## 2012-04-10 DIAGNOSIS — F411 Generalized anxiety disorder: Secondary | ICD-10-CM | POA: Insufficient documentation

## 2012-04-10 MED ORDER — IPRATROPIUM BROMIDE 0.02 % IN SOLN
0.5000 mg | Freq: Once | RESPIRATORY_TRACT | Status: AC
  Administered 2012-04-10: 0.5 mg via RESPIRATORY_TRACT
  Filled 2012-04-10: qty 2.5

## 2012-04-10 MED ORDER — ALBUTEROL SULFATE (5 MG/ML) 0.5% IN NEBU
5.0000 mg | INHALATION_SOLUTION | Freq: Once | RESPIRATORY_TRACT | Status: AC
  Administered 2012-04-10: 5 mg via RESPIRATORY_TRACT
  Filled 2012-04-10: qty 1

## 2012-04-10 MED ORDER — PREDNISONE 50 MG PO TABS: 50.0000 mg | ORAL_TABLET | Freq: Every day | ORAL | Status: DC

## 2012-04-10 MED ORDER — ALBUTEROL SULFATE HFA 108 (90 BASE) MCG/ACT IN AERS
2.0000 | INHALATION_SPRAY | RESPIRATORY_TRACT | Status: DC | PRN
  Filled 2012-04-10: qty 6.7

## 2012-04-10 MED ORDER — ALBUTEROL SULFATE HFA 108 (90 BASE) MCG/ACT IN AERS: 2.0000 | INHALATION_SPRAY | RESPIRATORY_TRACT | Status: DC | PRN

## 2012-04-10 MED ORDER — PREDNISONE 10 MG PO TABS
60.0000 mg | ORAL_TABLET | Freq: Once | ORAL | Status: AC
  Administered 2012-04-10: 60 mg via ORAL
  Filled 2012-04-10: qty 1

## 2012-04-10 NOTE — ED Provider Notes (Signed)
History     CSN: [id number]  Arrival date & time 04/10/12  1624   First MD Initiated Contact with Patient 04/10/12 1739      Chief Complaint  Patient presents with  . Shortness of Breath    (Consider location/radiation/quality/duration/timing/severity/associated sxs/prior treatment) HPI Pt presenting with c/o shortness of breath and cough.  She has a hx of asthma but has not had an asthma flare in many years.  For the past several days has had tight spasmodic cough and difficulty breathing.  No chest pain.  No fever/chills.  Has tried mucinex and decongestants without much relief.  Today sob became worse prompting ED visit.  Symptoms are constant.  There are no other associated systemic symptoms, there are no other alleviating or modifying factors.   Past Medical History  Diagnosis Date  . Anxiety   . PONV (postoperative nausea and vomiting)     Past Surgical History  Procedure Laterality Date  . Cesarean section      x 2  . Hernia repair    . Salpingoophorectomy  05/01/2011    Procedure: SALPINGO OOPHERECTOMY;  Surgeon: Dorien Chihuahua. Richardson Dopp, MD;  Location: [facility];  Service: Gynecology;  Laterality: Bilateral;  . Abdominal hysterectomy  04/2011    History reviewed. No pertinent family history.  History  Substance Use Topics  . Smoking status: Never Smoker   . Smokeless tobacco: Not on file  . Alcohol Use: No    OB History   Grav Para Term Preterm Abortions TAB SAB Ect Mult Living                  Review of Systems ROS reviewed and all otherwise negative except for mentioned in HPI  Allergies  Meperidine hcl  Home Medications   Current Outpatient Rx  Name  Route  Sig  Dispense  Refill  . acetaminophen (TYLENOL) 500 MG tablet   Oral   Take 500 mg by mouth as needed. Headache or pain         . albuterol (PROVENTIL HFA;VENTOLIN HFA) 108 (90 BASE) MCG/ACT inhaler   Inhalation   Inhale 2 puffs into the lungs every 4 (four) hours as needed for wheezing.   1  Inhaler   0   . Calcium-Vitamin D-Vitamin K (CALCIUM + D + K PO)   Oral   Take 1 tablet by mouth daily. Calcium, Vit D, Vit K  500mg /200units/38mcg         . cetirizine (ZYRTEC) 10 MG tablet   Oral   Take 10 mg by mouth daily.         . cyclobenzaprine (FLEXERIL) 10 MG tablet   Oral   Take 1 tablet (10 mg total) by mouth 3 (three) times daily as needed for muscle spasms.   30 tablet   0   . predniSONE (DELTASONE) 50 MG tablet   Oral   Take 1 tablet (50 mg total) by mouth daily.   4 tablet   0   . sertraline (ZOLOFT) 50 MG tablet   Oral   Take 1 tablet (50 mg total) by mouth daily.   30 tablet   11     BP 114/66  Pulse 93  Temp(Src) 98.2 F (36.8 C) (Oral)  Resp 20  Ht 5' (1.524 m)  Wt 122 lb (55.339 kg)  BMI 23.83 kg/m2  SpO2 100%  LMP 04/03/2011 Vitals reviewed Physical Exam Physical Examination: General appearance - alert, well appearing, and in no distress Mental status -  alert, oriented to person, place, and time Eyes - no scleral icterus, no conjunctival injection Mouth - mucous membranes moist, pharynx normal without lesions Chest - no rales or rhonchi, symmetric air entry, diffuse course wheezing with good air movement Heart - normal rate, regular rhythm, normal S1, S2, no murmurs, rubs, clicks or gallops Abdomen - soft, nontender, nondistended, no masses or organomegaly Extremities - peripheral pulses normal, no pedal edema, no clubbing or cyanosis Skin - normal coloration and turgor, no rashes  ED Course  Procedures (including critical care time)  Labs Reviewed - No data to display Dg Chest 2 View  04/10/2012  *RADIOLOGY REPORT*  Clinical Data: Shortness of breath.  CHEST - 2 VIEW  Comparison: 03/08/2012.  Findings: The cardiac silhouette, mediastinal and hilar contours are normal and stable.  The lungs are clear.  Mild hyperinflation. No pleural effusion.  The bony thorax is intact.  IMPRESSION: Mild hyperinflation but no infiltrates, edema or  effusions.   Original Report Authenticated By: Rudie Meyer, M.D.      1. Asthma exacerbation       MDM  Pt feels much improved after albuterol neb in ED.  Given albuterol MDI, CXR reassuring- images reviewed and interpreted by me as well.  Pt also started on prednisone.  Discharged with strict return precautions.  Pt agreeable with plan.        Ethelda Chick, MD 04/11/12 [phone]

## 2012-04-10 NOTE — Telephone Encounter (Signed)
Patient Information:  Caller Name: Tammy Long  Phone: [phone]  Patient: Tammy Long, Tammy Long  Gender: Female  DOB: [birth year]  Age: 57 Years  PCP: Tammy Long  Pregnant: No  Office Follow Up:  Does the office need to follow up with this patient?: No  Instructions For The Office: N/A  RN Note:  RN could hear the wheeze over the phone; pt does not have an inhaler available.  Symptoms  Reason For Call & Symptoms: pt reports she is having a lot of problems breathing pt has asthma and is wheezing  Reviewed Health History In EMR: Yes  Reviewed Medications In EMR: Yes  Reviewed Allergies In EMR: Yes  Reviewed Surgeries / Procedures: Yes  Date of Onset of Symptoms: 04/09/2012 OB / GYN:  LMP: Unknown  Guideline(s) Used:  Asthma Attack  Disposition Per Guideline:   Go to ED Now (or to Office with PCP Approval)  Reason For Disposition Reached:   Severe wheezing or coughing and doesn't have nebulizer or inhaler available  Advice Given:  N/A  RN Overrode Recommendation:  Go To ED  no appts found in  EPIC; RN advised for pt to be seen right away at the ED or UC

## 2012-04-10 NOTE — Telephone Encounter (Signed)
Patient was sent to the ED--To MD for review     KP

## 2012-04-10 NOTE — ED Notes (Signed)
Pt c/o SOB x 2 days.  HX asthma 

## 2012-04-10 NOTE — ED Notes (Signed)
Tammy Long, RRT at bedside for assessment.

## 2012-04-10 NOTE — Patient Instructions (Signed)
Instructed pt on the proper use of administering albuteral mdi vis arochamber pt tolerated well

## 2012-04-10 NOTE — ED Notes (Signed)
Rx x 2 given for albuterol and prednisone

## 2012-04-11 NOTE — Telephone Encounter (Signed)
Seen at the ER 

## 2012-04-24 ENCOUNTER — Ambulatory Visit (INDEPENDENT_AMBULATORY_CARE_PROVIDER_SITE_OTHER): Payer: No Typology Code available for payment source | Admitting: Internal Medicine

## 2012-04-24 ENCOUNTER — Encounter: Payer: Self-pay | Admitting: Internal Medicine

## 2012-04-24 VITALS — BP 118/74 | HR 77 | Temp 98.1°F | Wt 126.0 lb

## 2012-04-24 DIAGNOSIS — J309 Allergic rhinitis, unspecified: Secondary | ICD-10-CM | POA: Insufficient documentation

## 2012-04-24 DIAGNOSIS — J45909 Unspecified asthma, uncomplicated: Secondary | ICD-10-CM | POA: Insufficient documentation

## 2012-04-24 DIAGNOSIS — M549 Dorsalgia, unspecified: Secondary | ICD-10-CM | POA: Insufficient documentation

## 2012-04-24 MED ORDER — BECLOMETHASONE DIPROPIONATE 80 MCG/ACT IN AERS: 1.0000 | INHALATION_SPRAY | Freq: Two times a day (BID) | RESPIRATORY_TRACT | Status: DC

## 2012-04-24 MED ORDER — ALBUTEROL SULFATE HFA 108 (90 BASE) MCG/ACT IN AERS: 2.0000 | INHALATION_SPRAY | Freq: Four times a day (QID) | RESPIRATORY_TRACT | Status: DC | PRN

## 2012-04-24 MED ORDER — FLUTICASONE PROPIONATE 50 MCG/ACT NA SUSP: 2.0000 | Freq: Every day | NASAL | Status: DC

## 2012-04-24 NOTE — Patient Instructions (Addendum)
Use Qvar twice a day; Zyrtec and Flonase daily Albuterol only for cough or wheezing, you can take it as needed up to 4 times a day. If you've find yourself using albuterol more than 4 times a week let me know. Come back in 3 months for a followup.

## 2012-04-24 NOTE — Progress Notes (Signed)
  Subjective:    Patient ID: Tammy Long, female    DOB: 1955-10-19, 57 y.o.   MRN: [id number]  HPI ER followup Micah Flesher to the ER at 04/11/2012  W/  one week history of shortness or breath and cough, chest x-ray show hyperinflation, she felt better after nebulization and was released home on prednisone and albuterol prn. She denies any recent airplane trip or prolonged car trip. No leg swelling. Since the ER visit, she feels better, still has mild cough, mild shortness of breath and occasional wheezing, using albuterol about 3 times a day.  Also, has a two-year history of back pain, only happened whenever she is laying down in bed and tries to turn, the pain is in the right side, no radiation, it is brief in duration.  Past Medical History  Diagnosis Date  . Anxiety   . PONV (postoperative nausea and vomiting)   . Asthma    Past Surgical History  Procedure Laterality Date  . Cesarean section      x 2  . Hernia repair    . Salpingoophorectomy  05/01/2011    Procedure: SALPINGO OOPHERECTOMY;  Surgeon: Dorien Chihuahua. Richardson Dopp, MD;  Location: [facility];  Service: Gynecology;  Laterality: Bilateral;  . Abdominal hysterectomy  04/2011   History   Social History  . Marital Status: Married    Spouse Name: N/A    Number of Children: 2  . Years of Education: N/A   Occupational History  . Not on file.   Social History Main Topics  . Smoking status: Never Smoker   . Smokeless tobacco: Never Used  . Alcohol Use: No  . Drug Use: No  . Sexually Active: Not on file   Other Topics Concern  . Not on file   Social History Narrative  . No narrative on file    Review of Systems Denies fever or chills She has a lot of postnasal dripping, nose congestion and eyes itching (denies problems with her vision per se.) No sputum production No GERD symptoms     Objective:   Physical Exam General -- alert, well-developed HEENT -- TMs normal, throat w/o redness, face symmetric and not tender to  palpation Lungs -- normal respiratory effort, no intercostal retractions, no accessory muscle use, and normal breath sounds.   Heart-- normal rate, regular rhythm, no murmur, and no gallop.   Extremities-- no pretibial edema bilaterally; no tender at the trochanteric areas bilaterally. Hip rotation normal without pain. Back: Nontender to palpation in the thorax or lumbar spine.  Neurologic-- alert & oriented X3; gait, motor are normal. No antalgic posture Psych-- Cognition and judgment appear intact. Alert and cooperative with normal attention span and concentration.  not anxious appearing and not depressed appearing.      Assessment & Plan:

## 2012-04-24 NOTE — Assessment & Plan Note (Addendum)
on Zyrtec, symptoms not well-controlled,  has some eye itching. Plan: Add Flonase, if not better let me know

## 2012-04-24 NOTE — Assessment & Plan Note (Signed)
Mechanical back pain, does not like to take medication if possible. Refer to a chiropractor, Dr. Christell Faith

## 2012-04-24 NOTE — Assessment & Plan Note (Signed)
History of remote asthma, asymptomatic  up until the ER visit, still has some mild symptoms. Plan: Qvar Albuterol when necessary Treat allergic rhinitis Reassess in 3 months

## 2012-04-25 ENCOUNTER — Telehealth: Payer: Self-pay | Admitting: *Deleted

## 2012-04-25 NOTE — Telephone Encounter (Signed)
Pt left VM that the QVAR is expensive at $180. Pt would like to know if she can have a sample or be switch to something else. Pt notes that she spoke with her insurance and they suggested maybe pulmicort but it would still cost her $135.Please advise

## 2012-04-26 NOTE — Telephone Encounter (Signed)
Please provide Qvar samples, 2 inhalers

## 2012-04-30 ENCOUNTER — Telehealth: Payer: Self-pay | Admitting: Internal Medicine

## 2012-04-30 NOTE — Telephone Encounter (Signed)
Was in to see Dr Drue Novel on 3-5  He gave her rx for beclomethasone 37mcg/act  She cant get this one because it is too expensive.  She wants an rx for pulmicort flex  This is covered by her ins and is affordable  [facility] [address]

## 2012-04-30 NOTE — Telephone Encounter (Signed)
Pt aware Rx ready for pick up 

## 2012-05-01 MED ORDER — BUDESONIDE 180 MCG/ACT IN AEPB: 2.0000 | INHALATION_SPRAY | Freq: Two times a day (BID) | RESPIRATORY_TRACT | Status: DC

## 2012-05-01 MED ORDER — SERTRALINE HCL 50 MG PO TABS: 25.0000 mg | ORAL_TABLET | Freq: Every day | ORAL | Status: DC

## 2012-05-01 NOTE — Telephone Encounter (Signed)
Letter brought into office stating would you please fax my prescription into [facility] mail order prescription 90 day supply. Sertraline 50 mg, and estradiol 1 mg.  Also Dr Drue Novel wanted me to use the inhaler beclomethasone 80 mcg it cost #182.00 to fill for 1 month. Do you get samples of anything I can use: the least extensive my insurance could find was pulmicort flex at $132.00 for a month. This medicine has. To be called in by physician at [phone] to be authorized, Pleas ask Dr Drue Novel his opinion.

## 2012-05-01 NOTE — Telephone Encounter (Signed)
Discuss with patient  

## 2012-05-01 NOTE — Addendum Note (Signed)
Addended by: Candie Echevaria L on: 05/01/2012 11:48 AM   Modules accepted: Orders

## 2012-05-01 NOTE — Telephone Encounter (Signed)
Pr Dr paxz ok zoloft #90 1, no estradiol per GYN

## 2012-05-01 NOTE — Telephone Encounter (Signed)
done

## 2012-05-03 ENCOUNTER — Telehealth: Payer: Self-pay | Admitting: Internal Medicine

## 2012-05-03 NOTE — Telephone Encounter (Signed)
Pt states she is returning your call °

## 2012-06-12 ENCOUNTER — Emergency Department (HOSPITAL_BASED_OUTPATIENT_CLINIC_OR_DEPARTMENT_OTHER)
Admission: EM | Admit: 2012-06-12 | Discharge: 2012-06-12 | Disposition: A | Discharge status: Home / Self Care | Payer: No Typology Code available for payment source | Attending: Emergency Medicine | Admitting: Emergency Medicine

## 2012-06-12 ENCOUNTER — Encounter (HOSPITAL_BASED_OUTPATIENT_CLINIC_OR_DEPARTMENT_OTHER): Payer: Self-pay | Admitting: Emergency Medicine

## 2012-06-12 DIAGNOSIS — J45909 Unspecified asthma, uncomplicated: Secondary | ICD-10-CM | POA: Insufficient documentation

## 2012-06-12 DIAGNOSIS — Z79899 Other long term (current) drug therapy: Secondary | ICD-10-CM | POA: Insufficient documentation

## 2012-06-12 DIAGNOSIS — H109 Unspecified conjunctivitis: Secondary | ICD-10-CM | POA: Insufficient documentation

## 2012-06-12 DIAGNOSIS — F411 Generalized anxiety disorder: Secondary | ICD-10-CM | POA: Insufficient documentation

## 2012-06-12 MED ORDER — FLUORESCEIN SODIUM 1 MG OP STRP
ORAL_STRIP | OPHTHALMIC | Status: AC
  Filled 2012-06-12: qty 1

## 2012-06-12 MED ORDER — ERYTHROMYCIN 5 MG/GM OP OINT: TOPICAL_OINTMENT | OPHTHALMIC | Status: DC

## 2012-06-12 MED ORDER — TETRACAINE HCL 0.5 % OP SOLN
OPHTHALMIC | Status: AC
  Filled 2012-06-12: qty 2

## 2012-06-12 NOTE — ED Notes (Signed)
Pt c/o redness and drainage to eyes bilaterally

## 2012-06-12 NOTE — ED Provider Notes (Signed)
History     CSN: [id number]  Arrival date & time 06/12/12  0017   First MD Initiated Contact with Patient 06/12/12 0029      Chief Complaint  Patient presents with  . Eye Drainage    (Consider location/radiation/quality/duration/timing/severity/associated sxs/prior treatment) Patient is a 57 y.o. female presenting with conjunctivitis. The history is provided by the patient. No language interpreter was used.  Conjunctivitis  The current episode started today. The onset was gradual. The problem occurs continuously. The problem has been gradually worsening. The problem is moderate. Nothing relieves the symptoms. Nothing aggravates the symptoms. Associated symptoms include eye itching, eye discharge and eye redness. Pertinent negatives include no fever, no decreased vision and no photophobia. Both eyes are affected.The eye pain is not associated with movement. The eyelid exhibits no abnormality. She has been behaving normally. She has been eating and drinking normally. There were no sick contacts. She has received no recent medical care.  felt like a foreign body may have flown in the left eye this evening.  Thick colorless discharge per patient report.  Has missed dose of zyrtec  Past Medical History  Diagnosis Date  . Anxiety   . PONV (postoperative nausea and vomiting)   . Asthma     Past Surgical History  Procedure Laterality Date  . Cesarean section      x 2  . Hernia repair    . Salpingoophorectomy  05/01/2011    Procedure: SALPINGO OOPHERECTOMY;  Surgeon: Dorien Chihuahua. Richardson Dopp, MD;  Location: [facility];  Service: Gynecology;  Laterality: Bilateral;  . Abdominal hysterectomy  04/2011    No family history on file.  History  Substance Use Topics  . Smoking status: Never Smoker   . Smokeless tobacco: Never Used  . Alcohol Use: No    OB History   Grav Para Term Preterm Abortions TAB SAB Ect Mult Living                  Review of Systems  Constitutional: Negative for fever.   Eyes: Positive for discharge, redness and itching. Negative for photophobia.  All other systems reviewed and are negative.    Allergies  Meperidine hcl  Home Medications   Current Outpatient Rx  Name  Route  Sig  Dispense  Refill  . acetaminophen (TYLENOL) 500 MG tablet   Oral   Take 500 mg by mouth as needed. Headache or pain         . albuterol (PROVENTIL HFA;VENTOLIN HFA) 108 (90 BASE) MCG/ACT inhaler   Inhalation   Inhale 2 puffs into the lungs every 6 (six) hours as needed for wheezing.   1 Inhaler   2   . budesonide (PULMICORT) 180 MCG/ACT inhaler   Inhalation   Inhale 2 puffs into the lungs 2 (two) times daily.   1 Inhaler   4   . Calcium-Vitamin D-Vitamin K (CALCIUM + D + K PO)   Oral   Take 1 tablet by mouth daily. Calcium, Vit D, Vit K  500mg /200units/10mcg         . cetirizine (ZYRTEC) 10 MG tablet   Oral   Take 10 mg by mouth daily.         . fluticasone (FLONASE) 50 MCG/ACT nasal spray   Nasal   Place 2 sprays into the nose daily.   16 g   6   . sertraline (ZOLOFT) 50 MG tablet   Oral   Take 0.5 tablets (25 mg total) by mouth daily.  90 tablet   1     BP 140/66  Pulse 57  Temp(Src) 97.6 F (36.4 C) (Oral)  Resp 16  SpO2 98%  LMP 04/03/2011  Physical Exam  Constitutional: She is oriented to person, place, and time. She appears well-developed and well-nourished. No distress.  HENT:  Head: Normocephalic and atraumatic.  Mouth/Throat: Oropharynx is clear and moist.  Eyes: EOM are normal. Pupils are equal, round, and reactive to light. Right eye exhibits no chemosis, no discharge and no exudate. Left eye exhibits no chemosis, no discharge and no exudate. No foreign body present in the left eye. Right conjunctiva is injected. Left conjunctiva is injected.  Neck: Normal range of motion. Neck supple.  Cardiovascular: Normal rate and regular rhythm.   Pulmonary/Chest: Effort normal and breath sounds normal. She has no wheezes. She has no  rales.  Abdominal: Soft. Bowel sounds are normal. There is no tenderness. There is no rebound and no guarding.  Musculoskeletal: Normal range of motion.  Neurological: She is alert and oriented to person, place, and time.  Skin: Skin is warm and dry.    ED Course  Procedures (including critical care time)  Labs Reviewed - No data to display No results found.   No diagnosis found.    MDM  Likely allergic conjunctivitis.  Will add erythromycin to cover.  Follow up with ophthalmology for ongoing symptoms.  Patient verbalizes understanding and agrees to follow up        Ryheem Jay Smitty Cords, MD 06/12/12 [phone]

## 2012-11-04 ENCOUNTER — Other Ambulatory Visit: Payer: Self-pay | Admitting: General Practice

## 2012-11-04 ENCOUNTER — Telehealth: Payer: Self-pay | Admitting: Internal Medicine

## 2012-11-04 MED ORDER — BECLOMETHASONE DIPROPIONATE 80 MCG/ACT IN AERS: 1.0000 | INHALATION_SPRAY | Freq: Two times a day (BID) | RESPIRATORY_TRACT | Status: DC

## 2012-11-04 NOTE — Telephone Encounter (Signed)
Samples placed up front 

## 2012-11-04 NOTE — Telephone Encounter (Signed)
Patient called requesting samples of Qvar . Call [phone] when ready for pick up.

## 2012-11-28 ENCOUNTER — Encounter: Payer: Self-pay | Admitting: Internal Medicine

## 2013-05-17 ENCOUNTER — Other Ambulatory Visit: Payer: Self-pay | Admitting: Family Medicine

## 2013-05-30 ENCOUNTER — Telehealth: Payer: Self-pay | Admitting: *Deleted

## 2013-05-30 NOTE — Telephone Encounter (Signed)
Pt requesting estradiol 1mg   Last refilled - historical  Last OV- 04/24/12

## 2013-06-02 NOTE — Telephone Encounter (Signed)
Recommend to call her gynecologist, unable to refill

## 2013-06-04 NOTE — Telephone Encounter (Signed)
lmovm

## 2013-10-22 DIAGNOSIS — M7540 Impingement syndrome of unspecified shoulder: Secondary | ICD-10-CM | POA: Insufficient documentation

## 2013-10-23 ENCOUNTER — Telehealth: Payer: Self-pay | Admitting: Internal Medicine

## 2013-10-23 NOTE — Telephone Encounter (Signed)
Caller name: Dorothy  Call back number: [phone]   Reason for call:  Pt had flu shot on 9/1 at [facility].  Now pt is experiencing the flu like symptoms.  Pt wants to know if she should come in or do something.  No fever this morning.

## 2013-10-23 NOTE — Telephone Encounter (Signed)
Pt is currently at home in the bed. She received the flu shot on 9/1 around 12 noon.  Around 5 or 6 pm Tuesday (9/1) after receiving the vaccine, she started to experience a sore throat.  Since then she has also developed a runny nose, headache, fever/chills, puffy face, and soreness of the eyes and teeth.  She has self-treated with tylenol and herbal tea.  This has helped symptoms some, but she calling today to see if there is anything else she needs to do.    Please advise.

## 2013-10-23 NOTE — Telephone Encounter (Signed)
I think she is doing the right thing. If this is a reaction to the vaccination she should be better in a day or 2. If she is not improving, has severe fever, chills, headaches, a rash, increased cough ---->  Needs  to call the office

## 2013-10-23 NOTE — Telephone Encounter (Signed)
Called patient and reviewed Dr. Ethel Rana note with her.  Pt stated understanding and was in agreement with plan.

## 2013-10-24 ENCOUNTER — Ambulatory Visit (INDEPENDENT_AMBULATORY_CARE_PROVIDER_SITE_OTHER): Payer: BC Managed Care – PPO | Admitting: Internal Medicine

## 2013-10-24 ENCOUNTER — Encounter: Payer: Self-pay | Admitting: Internal Medicine

## 2013-10-24 VITALS — BP 126/82 | HR 102 | Temp 98.2°F | Wt 114.4 lb

## 2013-10-24 DIAGNOSIS — Z0000 Encounter for general adult medical examination without abnormal findings: Secondary | ICD-10-CM | POA: Insufficient documentation

## 2013-10-24 DIAGNOSIS — J45909 Unspecified asthma, uncomplicated: Secondary | ICD-10-CM

## 2013-10-24 DIAGNOSIS — T7840XA Allergy, unspecified, initial encounter: Secondary | ICD-10-CM

## 2013-10-24 NOTE — Progress Notes (Signed)
Subjective:    Patient ID: Tammy Long, female    DOB: 1955-06-11, 58 y.o.   MRN: [id number]  DOS:  10/24/2013 Type of visit - description : acute Interval history: Got a flu shot @ [facility] 10/21/2013 at noon, by 5 PM that day she started with several symptoms: Sore throat, ear ache, severe headaches, nose discharge. Also having fever and chills on and off since then, last night was the worst fever. Taking Tylenol and antihistaminics as needed. Yesterday her asthma started to "actup" with cough and some sputum production. Also has a question about a colonoscopy, see assessment and plan  ROS No rash Some sweats. Some nausea, no vomiting or diarrhea. Prior to this event, her asthma was doing well, was not taking any inhaler.   Past Medical History  Diagnosis Date  . Anxiety   . PONV (postoperative nausea and vomiting)   . Asthma   . Anesthesia complication     hard to wake up, amnesia     Past Surgical History  Procedure Laterality Date  . Cesarean section      x 2  . Hernia repair    . Salpingoophorectomy  05/01/2011    Procedure: SALPINGO OOPHERECTOMY;  Surgeon: Maeola Sarah. Landry Mellow, MD;  Location: [place] [facility];  Service: Gynecology;  Laterality: Bilateral;  . Abdominal hysterectomy  04/2011    History   Social History  . Marital Status: Married    Spouse Name: N/A    Number of Children: 2  . Years of Education: N/A   Occupational History  . owns a business     Social History Main Topics  . Smoking status: Never Smoker   . Smokeless tobacco: Never Used  . Alcohol Use: No  . Drug Use: No  . Sexual Activity: Not on file   Other Topics Concern  . Not on file   Social History Narrative  . No narrative on file        Medication List       This list is accurate as of: 10/24/13 11:59 PM.  Always use your most recent med list.               acetaminophen 500 MG tablet  Commonly known as:  TYLENOL  Take 500 mg by mouth as needed. Headache or pain     albuterol  108 (90 BASE) MCG/ACT inhaler  Commonly known as:  PROVENTIL HFA;VENTOLIN HFA  Inhale 2 puffs into the lungs every 6 (six) hours as needed for wheezing.     beclomethasone 80 MCG/ACT inhaler  Commonly known as:  QVAR  Inhale 1 puff into the lungs 2 (two) times daily.     CALCIUM + D + K PO  Take 1 tablet by mouth daily. Calcium, Vit D, Vit K  500mg /200units/58mcg     cetirizine 10 MG tablet  Commonly known as:  ZYRTEC  Take 10 mg by mouth daily.     fluticasone 50 MCG/ACT nasal spray  Commonly known as:  FLONASE  Place 2 sprays into the nose daily.     sertraline 50 MG tablet  Commonly known as:  ZOLOFT  Take one tablet daily. DUE for physical, please schedule [phone]           Objective:   Physical Exam BP 126/82  Pulse 102  Temp(Src) 98.2 F (36.8 C) (Oral)  Wt 114 lb 6.7 oz (51.9 kg)  SpO2 98%  LMP 04/03/2011  General -- alert, well-developed, NAD.  Neck --FROM HEENT--  Not pale. TMs normal, throat symmetric, no redness or discharge. Face symmetric, sinuses not tender to palpation. Nose not congested. Lungs -- normal respiratory effort, no intercostal retractions, no accessory muscle use, and few wheezes B.  Heart-- tachy, no murmur.   Extremities-- no pretibial edema bilaterally  Neurologic--  alert & oriented X3. Speech normal, gait appropriate for age, strength symmetric and appropriate for age.  Skin-- no rash Psych-- Cognition and judgment appear intact. Cooperative with normal attention span and concentration. No anxious or depressed appearing.     Assessment & Plan:   Allergic reaction to the flu shot Symptoms consistent with a flu shot reaction, recommend avoidance from this point on. Will treat conservatively, see instructions. Definitely call us if she is not gradually improving in the next few days, if she gets worse or has a rash  Today , I spent more than 25   min with the patient: >50% of the time counseling regards Chronic asthma  management and colon cancer screening

## 2013-10-24 NOTE — Assessment & Plan Note (Addendum)
CPX is done at gynecology however the patient has questions about a colonoscopy. She had what she described as a severe anesthesia reaction, it took several hours to wake up, she also had anesthesia  related amnesia. Afraid of having a colonoscopy. rec to discussed directly with GI as to want sedation is needed. Will also discuss Cologuard. Will call if interested in me aranging any of the above

## 2013-10-24 NOTE — Patient Instructions (Addendum)
Rest, fluids , tylenol or motrin For cough, take Mucinex DM twice a day as needed  Use QVar twice a day Use albuterol as needed Call if no better in few days Call anytime if the symptoms are severe, you have high fever, severe headaches, rash

## 2013-10-24 NOTE — Progress Notes (Signed)
Pre visit review using our clinic review tool, if applicable. No additional management support is needed unless otherwise documented below in the visit note. 

## 2013-10-24 NOTE — Assessment & Plan Note (Addendum)
Well-controlled up until yesterday. Plan: Use Qvar twice a day consistently albuterol for rescue treatment. Patient iseducated about the role of Qvar and rescue inhalers To call if she requires more than albuterol 3 times a week

## 2013-10-28 ENCOUNTER — Telehealth: Payer: Self-pay | Admitting: Internal Medicine

## 2013-10-28 MED ORDER — AZITHROMYCIN 250 MG PO TABS: ORAL_TABLET | ORAL | Status: DC

## 2013-10-28 NOTE — Telephone Encounter (Signed)
Bronchitis? Recommend a Z-Pak (sent) , Mucinex DM, office visit in one week if not improved

## 2013-10-28 NOTE — Telephone Encounter (Signed)
LMOM for Pt to return call.  

## 2013-10-28 NOTE — Telephone Encounter (Signed)
Caller name: Nayla  Relation to pt: self  Call back number: [phone]   Reason for call:   pt was seen in the office 10/24/13 symptoms still persisting coughing, soar throat, congested. Please advise

## 2013-10-28 NOTE — Telephone Encounter (Signed)
Please advise 

## 2013-10-29 ENCOUNTER — Other Ambulatory Visit: Payer: Self-pay

## 2013-10-29 ENCOUNTER — Encounter: Payer: BC Managed Care – PPO | Admitting: Internal Medicine

## 2013-10-29 NOTE — Progress Notes (Signed)
   Subjective:    Patient ID: Tammy Long, female    DOB: 25-Nov-1955, 58 y.o.   MRN: [id number]  DOS:  10/29/2013 Type of visit - description :  Interval history: .   ROS   Past Medical History  Diagnosis Date  . Anxiety   . PONV (postoperative nausea and vomiting)   . Asthma   . Anesthesia complication     hard to wake up, amnesia     Past Surgical History  Procedure Laterality Date  . Cesarean section      x 2  . Hernia repair    . Salpingoophorectomy  05/01/2011    Procedure: SALPINGO OOPHERECTOMY;  Surgeon: Maeola Sarah. Landry Mellow, MD;  Location: [facility];  Service: Gynecology;  Laterality: Bilateral;  . Abdominal hysterectomy  04/2011    History   Social History  . Marital Status: Married    Spouse Name: N/A    Number of Children: 2  . Years of Education: N/A   Occupational History  . owns a business     Social History Main Topics  . Smoking status: Never Smoker   . Smokeless tobacco: Never Used  . Alcohol Use: No  . Drug Use: No  . Sexual Activity: Not on file   Other Topics Concern  . Not on file   Social History Narrative  . No narrative on file        Medication List       This list is accurate as of: 10/29/13  2:05 PM.  Always use your most recent med list.               acetaminophen 500 MG tablet  Commonly known as:  TYLENOL  Take 500 mg by mouth as needed. Headache or pain     albuterol 108 (90 BASE) MCG/ACT inhaler  Commonly known as:  PROVENTIL HFA;VENTOLIN HFA  Inhale 2 puffs into the lungs every 6 (six) hours as needed for wheezing.     azithromycin 250 MG tablet  Commonly known as:  ZITHROMAX Z-PAK  2 tabs the first day, then one tablet daily for 4 days     beclomethasone 80 MCG/ACT inhaler  Commonly known as:  QVAR  Inhale 1 puff into the lungs 2 (two) times daily.     CALCIUM + D + K PO  Take 1 tablet by mouth daily. Calcium, Vit D, Vit K  500mg /200units/72mcg     cetirizine 10 MG tablet  Commonly known as:  ZYRTEC  Take 10  mg by mouth daily.     fluticasone 50 MCG/ACT nasal spray  Commonly known as:  FLONASE  Place 2 sprays into the nose daily.     sertraline 50 MG tablet  Commonly known as:  ZOLOFT  Take one tablet daily. DUE for physical, please schedule [phone]           Objective:   Physical Exam LMP 04/03/2011       Assessment & Plan:

## 2013-10-29 NOTE — Telephone Encounter (Signed)
Pt came into office today for OV, I informed her that Z-Pak was sent to [facility] and gave her option to still be seen, she decided to just try Z-Pak and will call back if not better.

## 2013-10-29 NOTE — Progress Notes (Deleted)
Pre visit review using our clinic review tool, if applicable. No additional management support is needed unless otherwise documented below in the visit note. 

## 2013-11-03 MED ORDER — FLUTICASONE PROPIONATE 50 MCG/ACT NA SUSP: 2.0000 | Freq: Every day | NASAL | Status: DC

## 2013-11-03 NOTE — Telephone Encounter (Signed)
Pt states the z-pak helped however she still has some congestion and drainage, pt would like to know if she can get an rx for flonase called in as well.

## 2013-11-03 NOTE — Telephone Encounter (Signed)
Flonase sent to [facility].

## 2013-11-24 NOTE — Progress Notes (Signed)
This encounter was created in error - please disregard.

## 2013-12-16 ENCOUNTER — Ambulatory Visit (INDEPENDENT_AMBULATORY_CARE_PROVIDER_SITE_OTHER): Payer: BC Managed Care – PPO | Admitting: Family

## 2013-12-16 ENCOUNTER — Encounter: Payer: Self-pay | Admitting: Family

## 2013-12-16 VITALS — BP 124/78 | HR 83 | Wt 120.4 lb

## 2013-12-16 DIAGNOSIS — M5442 Lumbago with sciatica, left side: Secondary | ICD-10-CM

## 2013-12-16 DIAGNOSIS — M5416 Radiculopathy, lumbar region: Secondary | ICD-10-CM

## 2013-12-16 MED ORDER — PREDNISONE 20 MG PO TABS: ORAL_TABLET | ORAL | Status: AC

## 2013-12-16 MED ORDER — KETOROLAC TROMETHAMINE 60 MG/2ML IM SOLN
60.0000 mg | Freq: Once | INTRAMUSCULAR | Status: AC
  Administered 2013-12-16: 60 mg via INTRAMUSCULAR

## 2013-12-16 MED ORDER — HYDROCODONE-ACETAMINOPHEN 10-325 MG PO TABS: 1.0000 | ORAL_TABLET | Freq: Four times a day (QID) | ORAL | Status: DC | PRN

## 2013-12-16 NOTE — Progress Notes (Signed)
Subjective:    Patient ID: Tammy Long, female    DOB: 1955-08-04, 58 y.o.   MRN: [id number]  Back Pain   58 year old white female, nonsmoker, is in today with c/o left back pain x 1 day. Has been taking Aleve that has helped. He rates the pain a 10 out of 10, worse with bending. Denies any known injury. Pain radiates down the left buttocks. Has a history of sciatica in the past but has not had any issues in about 6 years. Has been using a heat pad that's been helpful. Denies any urinary frequency, urgency, burning with urination.   Review of Systems  Constitutional: Negative.   HENT: Negative.   Respiratory: Negative.   Cardiovascular: Negative.   Gastrointestinal: Negative.   Endocrine: Negative.   Genitourinary: Negative.   Musculoskeletal: Positive for back pain.  Skin: Negative.   Allergic/Immunologic: Negative.   Neurological: Negative.   Hematological: Negative.   Psychiatric/Behavioral: Negative.    Past Medical History  Diagnosis Date  . Anxiety   . PONV (postoperative nausea and vomiting)   . Asthma   . Anesthesia complication     hard to wake up, amnesia     History   Social History  . Marital Status: Married    Spouse Name: N/A    Number of Children: 2  . Years of Education: N/A   Occupational History  . owns a business     Social History Main Topics  . Smoking status: Never Smoker   . Smokeless tobacco: Never Used  . Alcohol Use: No  . Drug Use: No  . Sexual Activity: Not on file   Other Topics Concern  . Not on file   Social History Narrative  . No narrative on file    Past Surgical History  Procedure Laterality Date  . Cesarean section      x 2  . Hernia repair    . Salpingoophorectomy  05/01/2011    Procedure: SALPINGO OOPHERECTOMY;  Surgeon: Maeola Sarah. Landry Mellow, MD;  Location: [facility];  Service: Gynecology;  Laterality: Bilateral;  . Abdominal hysterectomy  04/2011    No family history on file.  Allergies  Allergen Reactions  .  Influenza Vaccines     Aches, fevers x 3-4 days   . Meperidine Hcl Rash    Current Outpatient Prescriptions on File Prior to Visit  Medication Sig Dispense Refill  . acetaminophen (TYLENOL) 500 MG tablet Take 500 mg by mouth as needed. Headache or pain      . albuterol (PROVENTIL HFA;VENTOLIN HFA) 108 (90 BASE) MCG/ACT inhaler Inhale 2 puffs into the lungs every 6 (six) hours as needed for wheezing.  1 Inhaler  2  . azithromycin (ZITHROMAX Z-PAK) 250 MG tablet 2 tabs the first day, then one tablet daily for 4 days  5 tablet  0  . beclomethasone (QVAR) 80 MCG/ACT inhaler Inhale 1 puff into the lungs 2 (two) times daily.  1 Inhaler  1  . Calcium-Vitamin D-Vitamin K (CALCIUM + D + K PO) Take 1 tablet by mouth daily. Calcium, Vit D, Vit K  500mg /200units/33mcg      . cetirizine (ZYRTEC) 10 MG tablet Take 10 mg by mouth daily.      Marland Kitchen estradiol (ESTRACE) 1 MG tablet Take 1 mg by mouth daily.      . fluticasone (FLONASE) 50 MCG/ACT nasal spray Place 2 sprays into both nostrils daily.  16 g  3  . sertraline (ZOLOFT) 50 MG tablet  Take one tablet daily. DUE for physical, please schedule [phone]  30 tablet  0  . [DISCONTINUED] medroxyPROGESTERone (PROVERA) 10 MG tablet Take 10 mg by mouth daily. Stopped 2/21       No current facility-administered medications on file prior to visit.    BP 124/78  Pulse 83  Wt 120 lb 6.4 oz (54.613 kg)  LMP 02/11/2013chart     Objective:   Physical Exam  Constitutional: She is oriented to person, place, and time. She appears well-developed and well-nourished.  HENT:  Right Ear: External ear normal.  Left Ear: External ear normal.  Nose: Nose normal.  Mouth/Throat: Oropharynx is clear and moist.  Neck: Normal range of motion. Neck supple. No thyromegaly present.  Cardiovascular: Normal rate, regular rhythm and normal heart sounds.   Pulmonary/Chest: Effort normal and breath sounds normal.  Abdominal: Soft. Bowel sounds are normal.  Musculoskeletal: She  exhibits tenderness.  Pain with flexion at the hips at about 45 degrees. Non tender to palpation. Pain with left SLR.   Neurological: She is alert and oriented to person, place, and time.  Skin: Skin is warm and dry.  Psychiatric: She has a normal mood and affect.          Assessment & Plan:  Aretta was seen today for back pain.  Diagnoses and associated orders for this visit:  Lumbar radiculopathy, acute - ketorolac (TORADOL) injection 60 mg; Inject 2 mLs (60 mg total) into the muscle once.  Low back pain with left-sided sciatica, unspecified back pain laterality  Other Orders - predniSONE (DELTASONE) 20 MG tablet; 60mg  PO qam x 3 days, 40mg  po qam x 3 days, 20mg  qam x 3 days - HYDROcodone-acetaminophen (NORCO) 10-325 MG per tablet; Take 1 tablet by mouth every 6 (six) hours as needed.    Call the office with any questions or concerns. Recheck with PCP as scheduled and sooner as needed.

## 2013-12-16 NOTE — Patient Instructions (Signed)

## 2013-12-16 NOTE — Progress Notes (Signed)
Pre visit review using our clinic review tool, if applicable. No additional management support is needed unless otherwise documented below in the visit note. 

## 2013-12-17 ENCOUNTER — Ambulatory Visit: Payer: BC Managed Care – PPO | Admitting: Family Medicine

## 2014-02-20 LAB — HM PAP SMEAR: HM PAP: NORMAL

## 2014-04-07 ENCOUNTER — Telehealth: Payer: Self-pay

## 2014-04-07 NOTE — Telephone Encounter (Signed)
Pt states she is currently doing as instructed by nurse--Afrin x 3 days, chewing gum, and holding nose periodically and swallowing water.  Ear does not seem to be bother her as much.  She plans to give it a couple more days, and if not resolved will schedule an appointment at that time.

## 2014-04-07 NOTE — Telephone Encounter (Signed)
Date: 04/04/14 Nurse:  Susanne Greenhouse, RN  Chief Complaint:  Ear Fullness or Congestion Comment:  Caller says that her ear is clogged from the past week.    Reason for call:  Caller reports her right ear has been clogged for over a week.  Has used peroxide 2-3 x without success.  Denies pain throwing off equilibrium.    Disposition: See PCP when office is open (within 3 days).

## 2014-05-27 ENCOUNTER — Other Ambulatory Visit: Payer: Self-pay

## 2014-05-28 ENCOUNTER — Other Ambulatory Visit: Payer: Self-pay | Admitting: Internal Medicine

## 2014-06-02 ENCOUNTER — Encounter: Payer: Self-pay | Admitting: Internal Medicine

## 2014-06-02 ENCOUNTER — Ambulatory Visit (INDEPENDENT_AMBULATORY_CARE_PROVIDER_SITE_OTHER): Payer: No Typology Code available for payment source | Admitting: Internal Medicine

## 2014-06-02 VITALS — BP 118/66 | HR 72 | Temp 98.0°F | Ht 62.0 in | Wt 120.1 lb

## 2014-06-02 DIAGNOSIS — J4520 Mild intermittent asthma, uncomplicated: Secondary | ICD-10-CM | POA: Diagnosis not present

## 2014-06-02 DIAGNOSIS — F411 Generalized anxiety disorder: Secondary | ICD-10-CM | POA: Diagnosis not present

## 2014-06-02 MED ORDER — SERTRALINE HCL 50 MG PO TABS: 75.0000 mg | ORAL_TABLET | Freq: Every day | ORAL | Status: DC

## 2014-06-02 MED ORDER — ALBUTEROL SULFATE HFA 108 (90 BASE) MCG/ACT IN AERS: 2.0000 | INHALATION_SPRAY | Freq: Four times a day (QID) | RESPIRATORY_TRACT | Status: AC | PRN

## 2014-06-02 MED ORDER — FLUTICASONE PROPIONATE 50 MCG/ACT NA SUSP: 2.0000 | Freq: Every day | NASAL | Status: AC

## 2014-06-02 NOTE — Progress Notes (Signed)
Pre visit review using our clinic review tool, if applicable. No additional management support is needed unless otherwise documented below in the visit note. 

## 2014-06-02 NOTE — Progress Notes (Signed)
Subjective:    Patient ID: Tammy Long, female    DOB: 1955/04/17, 59 y.o.   MRN: [id number]  DOS:  06/02/2014 Type of visit - description :  Interval history: Asthma is well controlled with Qvar as needed, has not been using albuterol lately. Symptoms are infrequent. Likes to change Qvar due to cost Also, anxiety not well controlled on Zoloft, she is under a lot of stress due to her mother's health. She is the executor. In the past she took some xanax but did not like the side effects.   Review of Systems Denies chest pain or difficulty breathing No depression per se or suicidal ideas  Past Medical History  Diagnosis Date  . Anxiety   . PONV (postoperative nausea and vomiting)   . Asthma   . Anesthesia complication     hard to wake up, amnesia     Past Surgical History  Procedure Laterality Date  . Cesarean section      x 2  . Hernia repair    . Salpingoophorectomy  05/01/2011    Procedure: SALPINGO OOPHERECTOMY;  Surgeon: Maeola Sarah. Landry Mellow, MD;  Location: [facility];  Service: Gynecology;  Laterality: Bilateral;  . Abdominal hysterectomy  04/2011    History   Social History  . Marital Status: Married    Spouse Name: N/A  . Number of Children: 2  . Years of Education: N/A   Occupational History  . owns a business     Social History Main Topics  . Smoking status: Never Smoker   . Smokeless tobacco: Never Used  . Alcohol Use: No  . Drug Use: No  . Sexual Activity: Not on file   Other Topics Concern  . Not on file   Social History Narrative        Medication List       This list is accurate as of: 06/02/14 11:59 PM.  Always use your most recent med list.               acetaminophen 500 MG tablet  Commonly known as:  TYLENOL  Take 500 mg by mouth as needed. Headache or pain     albuterol 108 (90 BASE) MCG/ACT inhaler  Commonly known as:  PROVENTIL HFA;VENTOLIN HFA  Inhale 2 puffs into the lungs every 6 (six) hours as needed for wheezing.     BIOTIN  PO  Take 35,000 mcg by mouth daily.     CALCIUM + D + K PO  Take 1 tablet by mouth daily. Calcium, Vit D, Vit K  500mg /200units/13mcg     cetirizine 10 MG tablet  Commonly known as:  ZYRTEC  Take 10 mg by mouth daily.     estradiol 1 MG tablet  Commonly known as:  ESTRACE  Take 1 mg by mouth daily.     fluticasone 50 MCG/ACT nasal spray  Commonly known as:  FLONASE  Place 2 sprays into both nostrils daily.     sertraline 50 MG tablet  Commonly known as:  ZOLOFT  Take 1.5 tablets (75 mg total) by mouth daily.           Objective:   Physical Exam BP 118/66 mmHg  Pulse 72  Temp(Src) 98 F (36.7 C) (Oral)  Ht 5\' 2"  (1.575 m)  Wt 120 lb 2 oz (54.488 kg)  BMI 21.97 kg/m2  SpO2 99%  LMP 04/03/2011 General:   Well developed, well nourished . NAD.  HEENT:  Normocephalic . Face symmetric, atraumatic Lungs:  CTA B Normal respiratory effort, no intercostal retractions, no accessory muscle use. Heart: RRR,  no murmur.  Neurologic:  alert & oriented X3.  Speech normal, gait appropriate for age and unassisted Psych--  Cognition and judgment appear intact.  Cooperative with normal attention span and concentration.  Behavior appropriate. slt  anxious but not depressed appearing.        Assessment & Plan:    HRT to be managed by gynecology

## 2014-06-02 NOTE — Patient Instructions (Signed)
Sertraline 50 mg: Take 1 tablet daily for 2 weeks  Then increase to 1.5 tablets daily  Next visit in 6 weeks

## 2014-06-03 NOTE — Assessment & Plan Note (Signed)
A lot of stress due to family issues, currently on Zoloft 25 mg daily. We discussed the role of exercise and counseling, she has counseling available at church. Plan: Increase Zoloft gradually to 50 mg and then 75 mg daily. Reassess in 6 weeks.

## 2014-06-03 NOTE — Assessment & Plan Note (Addendum)
Asthma, Well-controlled with as needed Qvar. This medication is extremely expensive to the patient. Plan: Discontinue Qvar, use albuterol only as needed as a rescue inhaler

## 2014-10-06 ENCOUNTER — Telehealth: Payer: Self-pay | Admitting: Behavioral Health

## 2014-10-06 NOTE — Telephone Encounter (Signed)
Patient cancelled appointment for 10/07/14 and will call the office back at a later date to reschedule.

## 2014-10-07 ENCOUNTER — Encounter: Payer: No Typology Code available for payment source | Admitting: Internal Medicine

## 2014-11-06 ENCOUNTER — Ambulatory Visit (INDEPENDENT_AMBULATORY_CARE_PROVIDER_SITE_OTHER): Payer: No Typology Code available for payment source | Admitting: Physician Assistant

## 2014-11-06 ENCOUNTER — Encounter: Payer: Self-pay | Admitting: Physician Assistant

## 2014-11-06 VITALS — BP 118/64 | HR 81 | Temp 98.0°F | Resp 16 | Ht 62.0 in | Wt 124.0 lb

## 2014-11-06 DIAGNOSIS — M5010 Cervical disc disorder with radiculopathy, unspecified cervical region: Secondary | ICD-10-CM

## 2014-11-06 MED ORDER — HYDROCODONE-ACETAMINOPHEN 10-325 MG PO TABS: 1.0000 | ORAL_TABLET | Freq: Three times a day (TID) | ORAL | Status: DC | PRN

## 2014-11-06 MED ORDER — METHYLPREDNISOLONE 4 MG PO TBPK: ORAL_TABLET | ORAL | Status: DC

## 2014-11-06 NOTE — Patient Instructions (Signed)
Please avoid heavy lifting or overexertion. Take the steroid pack as directed. Use the norco as directed if needed for severe pain. Otherwise stick with Tylenol. You can resume your stretches as symptoms are resolving.  Follow-up if symptoms are not resolving with medication given today as we will have to obtain repeat imaging of your neck.

## 2014-11-06 NOTE — Progress Notes (Signed)
Patient presents to clinic today c/o neck and upper back pain. Endorses injury to neck 30 years ago after an episode of heavy lifting that resulted in herniated disc of cervical spine. Was previously followed by Neurosurgery for this. Since that time overall, she has been doing well. Avoids heavy lifting and certain exercises. Did heavy exercise episode about 1.5 weeks ago that started a flare up of symptoms. Pain is in neck and radiating into RUE down to her hand. Pain is sharp and not alleviated with certain movements. Has been doing some stretches with her personal trainer but has not noticed improvement. Has taken Ibuprofen and Tylenol without significant relief of symptoms.  Past Medical History  Diagnosis Date  . Anxiety   . PONV (postoperative nausea and vomiting)   . Asthma   . Anesthesia complication     hard to wake up, amnesia     Current Outpatient Prescriptions on File Prior to Visit  Medication Sig Dispense Refill  . acetaminophen (TYLENOL) 500 MG tablet Take 500 mg by mouth as needed. Headache or pain    . albuterol (PROVENTIL HFA;VENTOLIN HFA) 108 (90 BASE) MCG/ACT inhaler Inhale 2 puffs into the lungs every 6 (six) hours as needed for wheezing. 1 Inhaler 3  . BIOTIN PO Take 35,000 mcg by mouth daily.    . Calcium-Vitamin D-Vitamin K (CALCIUM + D + K PO) Take 1 tablet by mouth daily. Calcium, Vit D, Vit K  500mg /200units/13mcg    . cetirizine (ZYRTEC) 10 MG tablet Take 10 mg by mouth daily.    Marland Kitchen estradiol (ESTRACE) 1 MG tablet Take 1 mg by mouth daily.    . fluticasone (FLONASE) 50 MCG/ACT nasal spray Place 2 sprays into both nostrils daily. 16 g 12  . sertraline (ZOLOFT) 50 MG tablet Take 1.5 tablets (75 mg total) by mouth daily. (Patient taking differently: Take 25 mg by mouth daily. ) 60 tablet 2  . [DISCONTINUED] medroxyPROGESTERone (PROVERA) 10 MG tablet Take 10 mg by mouth daily. Stopped 2/21     No current facility-administered medications on file prior to visit.     Allergies  Allergen Reactions  . Influenza Vaccines     Aches, fevers x 3-4 days   . Meperidine Hcl Rash    No family history on file.  Social History   Social History  . Marital Status: Married    Spouse Name: N/A  . Number of Children: 2  . Years of Education: N/A   Occupational History  . owns a business     Social History Main Topics  . Smoking status: Never Smoker   . Smokeless tobacco: Never Used  . Alcohol Use: No  . Drug Use: No  . Sexual Activity: Not on file   Other Topics Concern  . Not on file   Social History Narrative   Review of Systems - See HPI.  All other ROS are negative.  BP 118/64 mmHg  Pulse 81  Temp(Src) 98 F (36.7 C) (Oral)  Resp 16  Ht 5\' 2"  (1.575 m)  Wt 124 lb (56.246 kg)  BMI 22.67 kg/m2  SpO2 98%  LMP 04/03/2011  Physical Exam  Constitutional: She is oriented to person, place, and time.  Cardiovascular: Normal rate, regular rhythm, normal heart sounds and intact distal pulses.   Pulmonary/Chest: Effort normal and breath sounds normal. No respiratory distress. She has no wheezes. She has no rales. She exhibits no tenderness.  Musculoskeletal:       Right shoulder: Normal.  Left shoulder: Normal.       Cervical back: She exhibits pain. She exhibits normal range of motion, no tenderness, no bony tenderness and no spasm.  Neurological: She is alert and oriented to person, place, and time.  Skin: Skin is warm and dry. No rash noted.  Psychiatric: Affect normal.  Vitals reviewed.   No results found for this or any previous visit (from the past 2160 hour(s)).  Assessment/Plan: Cervical disc disorder with radiculopathy of cervical region Rx Medrol dose pack. Norco for severe pain. Tylenol for mild pain. Supportive measures and stretching exercises reviewed. Follow-up if symptoms not resolving as will need repeat imaging as last imaging 30 years prior.

## 2014-11-06 NOTE — Progress Notes (Signed)
Pre visit review using our clinic review tool, if applicable. No additional management support is needed unless otherwise documented below in the visit note. 

## 2014-11-06 NOTE — Assessment & Plan Note (Signed)
Rx Medrol dose pack. Norco for severe pain. Tylenol for mild pain. Supportive measures and stretching exercises reviewed. Follow-up if symptoms not resolving as will need repeat imaging as last imaging 30 years prior.

## 2014-11-13 ENCOUNTER — Encounter: Payer: Self-pay | Admitting: Physician Assistant

## 2014-11-13 ENCOUNTER — Ambulatory Visit (HOSPITAL_BASED_OUTPATIENT_CLINIC_OR_DEPARTMENT_OTHER)
Admission: RE | Admit: 2014-11-13 | Discharge: 2014-11-13 | Disposition: A | Discharge status: Home / Self Care | Payer: No Typology Code available for payment source | Source: Ambulatory Visit | Attending: Physician Assistant | Admitting: Physician Assistant

## 2014-11-13 ENCOUNTER — Telehealth: Payer: Self-pay | Admitting: Physician Assistant

## 2014-11-13 ENCOUNTER — Ambulatory Visit (INDEPENDENT_AMBULATORY_CARE_PROVIDER_SITE_OTHER): Payer: No Typology Code available for payment source | Admitting: Physician Assistant

## 2014-11-13 VITALS — BP 118/78 | HR 76 | Temp 97.9°F | Resp 16 | Ht 62.0 in | Wt 123.2 lb

## 2014-11-13 DIAGNOSIS — M542 Cervicalgia: Secondary | ICD-10-CM | POA: Insufficient documentation

## 2014-11-13 DIAGNOSIS — M5010 Cervical disc disorder with radiculopathy, unspecified cervical region: Secondary | ICD-10-CM | POA: Diagnosis not present

## 2014-11-13 DIAGNOSIS — G8929 Other chronic pain: Secondary | ICD-10-CM | POA: Diagnosis not present

## 2014-11-13 DIAGNOSIS — R1013 Epigastric pain: Secondary | ICD-10-CM | POA: Diagnosis not present

## 2014-11-13 DIAGNOSIS — M5090 Cervical disc disorder, unspecified, unspecified cervical region: Secondary | ICD-10-CM | POA: Diagnosis not present

## 2014-11-13 DIAGNOSIS — M5032 Other cervical disc degeneration, mid-cervical region: Secondary | ICD-10-CM | POA: Insufficient documentation

## 2014-11-13 LAB — CBC WITH DIFFERENTIAL/PLATELET
BASOS PCT: 0.4 % (ref 0.0–3.0)
Basophils Absolute: 0 10*3/uL (ref 0.0–0.1)
EOS ABS: 0.4 10*3/uL (ref 0.0–0.7)
EOS PCT: 3.9 % (ref 0.0–5.0)
HEMATOCRIT: 48.9 % — AB (ref 36.0–46.0)
Hemoglobin: 16.7 g/dL — ABNORMAL HIGH (ref 12.0–15.0)
LYMPHS PCT: 38.2 % (ref 12.0–46.0)
Lymphs Abs: 3.8 10*3/uL (ref 0.7–4.0)
MCHC: 34.1 g/dL (ref 30.0–36.0)
MCV: 91.3 fl (ref 78.0–100.0)
Monocytes Absolute: 0.8 10*3/uL (ref 0.1–1.0)
Monocytes Relative: 8.1 % (ref 3.0–12.0)
NEUTROS ABS: 5 10*3/uL (ref 1.4–7.7)
Neutrophils Relative %: 49.4 % (ref 43.0–77.0)
PLATELETS: 238 10*3/uL (ref 150.0–400.0)
RBC: 5.36 Mil/uL — ABNORMAL HIGH (ref 3.87–5.11)
RDW: 12.4 % (ref 11.5–15.5)
WBC: 10 10*3/uL (ref 4.0–10.5)

## 2014-11-13 LAB — COMPREHENSIVE METABOLIC PANEL
ALBUMIN: 4.6 g/dL (ref 3.5–5.2)
ALT: 13 U/L (ref 0–35)
AST: 18 U/L (ref 0–37)
Alkaline Phosphatase: 56 U/L (ref 39–117)
BUN: 15 mg/dL (ref 6–23)
CALCIUM: 10.8 mg/dL — AB (ref 8.4–10.5)
CHLORIDE: 103 meq/L (ref 96–112)
CO2: 29 meq/L (ref 19–32)
Creatinine, Ser: 0.77 mg/dL (ref 0.40–1.20)
GFR: 82.18 mL/min (ref >60.00)
Glucose, Bld: 99 mg/dL (ref 70–99)
POTASSIUM: 4 meq/L (ref 3.5–5.1)
Sodium: 141 mEq/L (ref 135–145)
Total Bilirubin: 0.7 mg/dL (ref 0.2–1.2)
Total Protein: 7.5 g/dL (ref 6.0–8.3)

## 2014-11-13 LAB — H. PYLORI ANTIBODY, IGG: H PYLORI IGG: NEGATIVE

## 2014-11-13 MED ORDER — HYDROCODONE-ACETAMINOPHEN 10-325 MG PO TABS: 1.0000 | ORAL_TABLET | Freq: Three times a day (TID) | ORAL | Status: DC | PRN

## 2014-11-13 NOTE — Telephone Encounter (Signed)
X-ray shows multilevel degenerative discs. Needs MRI. I have placed order.

## 2014-11-13 NOTE — Patient Instructions (Signed)
Please go to the lab for blood work. Then go downstairs to the first floor for imaging. I will call you with all results.  Limit spicy foods and start a daily probiotic. Stay well hydrated. We will treat based on lab results.  For the neck, we will proceed with MRI based on x-ray results. Please consider taking the pain medication as directed when needed for moderate to severe pain. Otherwise take an ES Tylenol.  Limit heavy lifting and rest.

## 2014-11-13 NOTE — Progress Notes (Signed)
Patient presents to clinic today c/o continued neck pain intermittently with radiation into upper extremities, now with weakness bilaterally. Has not been taking pain medication. Has been doing exercises and warm soaks to help with pain. Denies radiation of pain into lower spine.  Patient also notes intermittent epigastric discomfort over the last year. Denies pain with eating. Denies unexplained weight loss. Denies melena, hematochezia or tenesmus. Denies alcohol consumption or NSAID use.  Past Medical History  Diagnosis Date  . Anxiety   . PONV (postoperative nausea and vomiting)   . Asthma   . Anesthesia complication     hard to wake up, amnesia     Current Outpatient Prescriptions on File Prior to Visit  Medication Sig Dispense Refill  . acetaminophen (TYLENOL) 500 MG tablet Take 500 mg by mouth as needed. Headache or pain    . albuterol (PROVENTIL HFA;VENTOLIN HFA) 108 (90 BASE) MCG/ACT inhaler Inhale 2 puffs into the lungs every 6 (six) hours as needed for wheezing. 1 Inhaler 3  . BIOTIN PO Take 35,000 mcg by mouth daily.    . Calcium-Vitamin D-Vitamin K (CALCIUM + D + K PO) Take 1 tablet by mouth daily. Calcium, Vit D, Vit K  52m/200units/40mcg    . cetirizine (ZYRTEC) 10 MG tablet Take 10 mg by mouth daily.    .Marland Kitchenestradiol (ESTRACE) 1 MG tablet Take 1 mg by mouth daily.    . fluticasone (FLONASE) 50 MCG/ACT nasal spray Place 2 sprays into both nostrils daily. 16 g 12  . sertraline (ZOLOFT) 50 MG tablet Take 1.5 tablets (75 mg total) by mouth daily. (Patient taking differently: Take 25 mg by mouth daily. Patient is Weaning off of Medication, currently Takes 25 mg Daily) 60 tablet 2  . [DISCONTINUED] medroxyPROGESTERone (PROVERA) 10 MG tablet Take 10 mg by mouth daily. Stopped 2/21     No current facility-administered medications on file prior to visit.    Allergies  Allergen Reactions  . Influenza Vaccines     Aches, fevers x 3-4 days   . Meperidine Hcl Rash    No  family history on file.  Social History   Social History  . Marital Status: Married    Spouse Name: N/A  . Number of Children: 2  . Years of Education: N/A   Occupational History  . owns a business     Social History Main Topics  . Smoking status: Never Smoker   . Smokeless tobacco: Never Used  . Alcohol Use: No  . Drug Use: No  . Sexual Activity: Not Asked   Other Topics Concern  . None   Social History Narrative    Review of Systems - See HPI.  All other ROS are negative.  BP 118/78 mmHg  Pulse 76  Temp(Src) 97.9 F (36.6 C) (Oral)  Resp 16  Ht '5\' 2"'  (1.575 m)  Wt 123 lb 4 oz (55.906 kg)  BMI 22.54 kg/m2  SpO2 99%  LMP 04/03/2011  Physical Exam  Constitutional: She is oriented to person, place, and time and well-developed, well-nourished, and in no distress.  HENT:  Head: Normocephalic and atraumatic.  Eyes: Conjunctivae are normal.  Cardiovascular: Normal rate, regular rhythm, normal heart sounds and intact distal pulses.   Pulmonary/Chest: Effort normal and breath sounds normal. No respiratory distress. She has no wheezes. She has no rales. She exhibits no tenderness.  Abdominal: Soft. Bowel sounds are normal. She exhibits no distension and no mass. There is no tenderness. There is no rebound and  no guarding.  Musculoskeletal:       Cervical back: She exhibits pain. She exhibits normal range of motion, no tenderness, no bony tenderness and no spasm.  Neurological: She is alert and oriented to person, place, and time.  Skin: Skin is warm and dry. No rash noted.  Psychiatric: Affect normal.  Vitals reviewed.   Recent Results (from the past 2160 hour(s))  CBC w/Diff     Status: Abnormal   Collection Time: 11/13/14 11:12 AM  Result Value Ref Range   WBC 10.0 4.0 - 10.5 K/uL   RBC 5.36 (H) 3.87 - 5.11 Mil/uL   Hemoglobin 16.7 (H) 12.0 - 15.0 g/dL   HCT 48.9 (H) 36.0 - 46.0 %   MCV 91.3 78.0 - 100.0 fl   MCHC 34.1 30.0 - 36.0 g/dL   RDW 12.4 11.5 - 15.5  %   Platelets 238.0 150.0 - 400.0 K/uL   Neutrophils Relative % 49.4 43.0 - 77.0 %   Lymphocytes Relative 38.2 12.0 - 46.0 %   Monocytes Relative 8.1 3.0 - 12.0 %   Eosinophils Relative 3.9 0.0 - 5.0 %   Basophils Relative 0.4 0.0 - 3.0 %   Neutro Abs 5.0 1.4 - 7.7 K/uL   Lymphs Abs 3.8 0.7 - 4.0 K/uL   Monocytes Absolute 0.8 0.1 - 1.0 K/uL   Eosinophils Absolute 0.4 0.0 - 0.7 K/uL   Basophils Absolute 0.0 0.0 - 0.1 K/uL  Comp Met (CMET)     Status: Abnormal   Collection Time: 11/13/14 11:12 AM  Result Value Ref Range   Sodium 141 135 - 145 mEq/L   Potassium 4.0 3.5 - 5.1 mEq/L   Chloride 103 96 - 112 mEq/L   CO2 29 19 - 32 mEq/L   Glucose, Bld 99 70 - 99 mg/dL   BUN 15 6 - 23 mg/dL   Creatinine, Ser 0.77 0.40 - 1.20 mg/dL   Total Bilirubin 0.7 0.2 - 1.2 mg/dL   Alkaline Phosphatase 56 39 - 117 U/L   AST 18 0 - 37 U/L   ALT 13 0 - 35 U/L   Total Protein 7.5 6.0 - 8.3 g/dL   Albumin 4.6 3.5 - 5.2 g/dL   Calcium 10.8 (H) 8.4 - 10.5 mg/dL   GFR 82.18 >60.00 mL/min  H. pylori antibody, IgG     Status: None   Collection Time: 11/13/14 11:12 AM  Result Value Ref Range   H Pylori IgG Negative Negative    Assessment/Plan: Cervical disc disorder with radiculopathy of cervical region Will obtain x-ray to assess and proceed quickly with MRI as symptoms seem related to nerve compression. Patient encouraged to take the Norco as directed. Supportive measures reiterated. Will refer to Neurosurgery based on imaging findings.  Abdominal pain, chronic, epigastric Unclear etiology. Denies reflux symptoms of symptoms consistent with classic ulceration. Will check CBC, CMP and H. Pylori. Begin probiotic daily. Increase fluids. Trigger foods discussed. Will refer to GI if labs unremarkable.

## 2014-11-13 NOTE — Progress Notes (Signed)
Pre visit review using our clinic review tool, if applicable. No additional management support is needed unless otherwise documented below in the visit note/SLS  

## 2014-11-14 DIAGNOSIS — R1013 Epigastric pain: Principal | ICD-10-CM

## 2014-11-14 DIAGNOSIS — G8929 Other chronic pain: Secondary | ICD-10-CM | POA: Insufficient documentation

## 2014-11-14 NOTE — Assessment & Plan Note (Signed)
Unclear etiology. Denies reflux symptoms of symptoms consistent with classic ulceration. Will check CBC, CMP and H. Pylori. Begin probiotic daily. Increase fluids. Trigger foods discussed. Will refer to GI if labs unremarkable.

## 2014-11-14 NOTE — Assessment & Plan Note (Signed)
Will obtain x-ray to assess and proceed quickly with MRI as symptoms seem related to nerve compression. Patient encouraged to take the Norco as directed. Supportive measures reiterated. Will refer to Neurosurgery based on imaging findings.

## 2014-11-16 ENCOUNTER — Telehealth: Payer: Self-pay | Admitting: Physician Assistant

## 2014-11-16 ENCOUNTER — Telehealth: Payer: Self-pay | Admitting: Internal Medicine

## 2014-11-16 ENCOUNTER — Encounter: Payer: Self-pay | Admitting: Gastroenterology

## 2014-11-16 DIAGNOSIS — R1013 Epigastric pain: Principal | ICD-10-CM

## 2014-11-16 DIAGNOSIS — G8929 Other chronic pain: Secondary | ICD-10-CM

## 2014-11-16 NOTE — Telephone Encounter (Signed)
Addressed. See result notes and other phone note from 11/16/14

## 2014-11-16 NOTE — Telephone Encounter (Signed)
Caller name:Tammy Long Relationship to patient:self Can be [phone] Pharmacy:  Reason for call:wants the results from her x ray prior to scheduling for the MRI

## 2014-11-16 NOTE — Telephone Encounter (Signed)
Pt calling to f/u on results from 11/13/14. She has to run out and won't have phone with her for the next 30-45 minutes. Please call after 9:15am today.

## 2014-11-16 NOTE — Telephone Encounter (Signed)
Spoke with patient regarding x-ray results, lab results and need for MRI. Ok to call patient to schedule MRI.

## 2014-11-24 ENCOUNTER — Telehealth: Payer: Self-pay | Admitting: Internal Medicine

## 2014-11-24 ENCOUNTER — Encounter: Payer: Self-pay | Admitting: Physician Assistant

## 2014-11-24 DIAGNOSIS — M5010 Cervical disc disorder with radiculopathy, unspecified cervical region: Secondary | ICD-10-CM

## 2014-11-24 DIAGNOSIS — M4802 Spinal stenosis, cervical region: Secondary | ICD-10-CM

## 2014-11-24 NOTE — Telephone Encounter (Signed)
Pt called regarding back/neck pain. MRI is scheduled for 11/25/14. She is wanting results and referral sent directly to Neurology for asap appt as she is in a great deal of pain. Please call pt as needed.

## 2014-11-24 NOTE — Telephone Encounter (Signed)
We can definitely send over results with referral as soon as we have them but have no control over when Neurosurgery can see her -- will do what we can.

## 2014-11-25 ENCOUNTER — Ambulatory Visit (HOSPITAL_COMMUNITY)
Admission: RE | Admit: 2014-11-25 | Discharge: 2014-11-25 | Disposition: A | Discharge status: Home / Self Care | Payer: No Typology Code available for payment source | Source: Ambulatory Visit | Attending: Physician Assistant | Admitting: Physician Assistant

## 2014-11-25 DIAGNOSIS — M5010 Cervical disc disorder with radiculopathy, unspecified cervical region: Secondary | ICD-10-CM | POA: Insufficient documentation

## 2014-11-25 DIAGNOSIS — M50222 Other cervical disc displacement at C5-C6 level: Secondary | ICD-10-CM | POA: Diagnosis not present

## 2014-11-25 DIAGNOSIS — M2578 Osteophyte, vertebrae: Secondary | ICD-10-CM | POA: Insufficient documentation

## 2014-11-25 DIAGNOSIS — M4802 Spinal stenosis, cervical region: Secondary | ICD-10-CM | POA: Insufficient documentation

## 2014-11-25 NOTE — Telephone Encounter (Signed)
MRI results reviewed. Referral placed as Urgent. Patient tolerating pain medication with good relief of symptoms.

## 2014-12-08 ENCOUNTER — Encounter: Payer: Self-pay | Admitting: Physician Assistant

## 2015-01-05 ENCOUNTER — Ambulatory Visit: Payer: No Typology Code available for payment source | Admitting: Gastroenterology

## 2015-01-25 ENCOUNTER — Telehealth: Payer: Self-pay | Admitting: Internal Medicine

## 2015-01-25 NOTE — Telephone Encounter (Signed)
Caller name: Mickayla   Relationship to patient: Self   Can be reached: [phone]  Pharmacy: [facility], [address] [place]  Reason for call: Pt says that she is going out of town at 12 noon but she feels terrible. She says that she has congestion, stuffy/runny nose, sore throat. She would like to know if her PCP could call her something in to help. She would like to pick it up at the pharmacy before she leave town.   Please assist further.

## 2015-01-26 NOTE — Telephone Encounter (Signed)
Pt would have needed to be seen. However, I was off out work yesterday, Santiago Glad was the covering clinician.

## 2015-03-10 ENCOUNTER — Telehealth: Payer: Self-pay | Admitting: Behavioral Health

## 2015-03-10 NOTE — Telephone Encounter (Signed)
The following gaps in care were assessed: Mammogram Pap (past Hx of hysterectomy) Colonoscopy  Attempted to reach patient to discuss the above gaps and possibly set up referral appointments. Left a message for patient to return call when available.

## 2015-05-22 LAB — HM MAMMOGRAPHY: HM Mammogram: NORMAL (ref 0–4)

## 2015-05-26 DIAGNOSIS — M5023 Other cervical disc displacement, cervicothoracic region: Secondary | ICD-10-CM | POA: Diagnosis not present

## 2015-05-26 DIAGNOSIS — S39012D Strain of muscle, fascia and tendon of lower back, subsequent encounter: Secondary | ICD-10-CM | POA: Diagnosis not present

## 2015-05-26 DIAGNOSIS — M542 Cervicalgia: Secondary | ICD-10-CM | POA: Diagnosis not present

## 2015-05-26 DIAGNOSIS — M545 Low back pain: Secondary | ICD-10-CM | POA: Diagnosis not present

## 2015-05-26 DIAGNOSIS — M546 Pain in thoracic spine: Secondary | ICD-10-CM | POA: Diagnosis not present

## 2015-06-17 ENCOUNTER — Telehealth: Payer: Self-pay | Admitting: Internal Medicine

## 2015-06-17 ENCOUNTER — Ambulatory Visit: Payer: Self-pay | Admitting: Internal Medicine

## 2015-06-21 NOTE — Telephone Encounter (Signed)
Pt was no show 06/17/15 9:30am for acute appt, pt has not rescheduled, 1st no show and 1 cancellation w/in 12 months, charge or no charge?

## 2015-06-21 NOTE — Telephone Encounter (Signed)
no

## 2015-06-25 ENCOUNTER — Ambulatory Visit (HOSPITAL_BASED_OUTPATIENT_CLINIC_OR_DEPARTMENT_OTHER)
Admission: RE | Admit: 2015-06-25 | Discharge: 2015-06-25 | Disposition: A | Discharge status: Home / Self Care | Payer: BLUE CROSS/BLUE SHIELD | Source: Ambulatory Visit | Attending: Medical | Admitting: Medical

## 2015-06-25 ENCOUNTER — Telehealth: Payer: Self-pay | Admitting: Medical

## 2015-06-25 ENCOUNTER — Ambulatory Visit (INDEPENDENT_AMBULATORY_CARE_PROVIDER_SITE_OTHER): Payer: BLUE CROSS/BLUE SHIELD | Admitting: Medical

## 2015-06-25 ENCOUNTER — Encounter: Payer: Self-pay | Admitting: Medical

## 2015-06-25 VITALS — BP 116/64 | HR 77 | Temp 98.3°F | Ht 62.0 in | Wt 120.2 lb

## 2015-06-25 DIAGNOSIS — R1033 Periumbilical pain: Secondary | ICD-10-CM | POA: Insufficient documentation

## 2015-06-25 DIAGNOSIS — R1084 Generalized abdominal pain: Secondary | ICD-10-CM

## 2015-06-25 DIAGNOSIS — R748 Abnormal levels of other serum enzymes: Secondary | ICD-10-CM

## 2015-06-25 LAB — COMPREHENSIVE METABOLIC PANEL
ALK PHOS: 50 U/L (ref 39–117)
ALT: 10 U/L (ref 0–35)
AST: 21 U/L (ref 0–37)
Albumin: 4.5 g/dL (ref 3.5–5.2)
BILIRUBIN TOTAL: 0.7 mg/dL (ref 0.2–1.2)
BUN: 16 mg/dL (ref 6–23)
CO2: 27 meq/L (ref 19–32)
CREATININE: 0.69 mg/dL (ref 0.40–1.20)
Calcium: 10.4 mg/dL (ref 8.4–10.5)
Chloride: 106 mEq/L (ref 96–112)
GFR: 93.07 mL/min (ref >60.00)
GLUCOSE: 91 mg/dL (ref 70–99)
Potassium: 3.7 mEq/L (ref 3.5–5.1)
SODIUM: 140 meq/L (ref 135–145)
TOTAL PROTEIN: 6.9 g/dL (ref 6.0–8.3)

## 2015-06-25 LAB — CBC WITH DIFFERENTIAL/PLATELET
BASOS PCT: 0.5 % (ref 0.0–3.0)
Basophils Absolute: 0 10*3/uL (ref 0.0–0.1)
EOS ABS: 0.4 10*3/uL (ref 0.0–0.7)
EOS PCT: 5.7 % — AB (ref 0.0–5.0)
HCT: 45.9 % (ref 36.0–46.0)
HEMOGLOBIN: 15.6 g/dL — AB (ref 12.0–15.0)
LYMPHS PCT: 35.4 % (ref 12.0–46.0)
Lymphs Abs: 2.5 10*3/uL (ref 0.7–4.0)
MCHC: 34 g/dL (ref 30.0–36.0)
MCV: 89.9 fl (ref 78.0–100.0)
MONOS PCT: 8.4 % (ref 3.0–12.0)
Monocytes Absolute: 0.6 10*3/uL (ref 0.1–1.0)
NEUTROS ABS: 3.6 10*3/uL (ref 1.4–7.7)
NEUTROS PCT: 50 % (ref 43.0–77.0)
Platelets: 201 10*3/uL (ref 150.0–400.0)
RBC: 5.11 Mil/uL (ref 3.87–5.11)
RDW: 13.1 % (ref 11.5–15.5)
WBC: 7.1 10*3/uL (ref 4.0–10.5)

## 2015-06-25 LAB — POC URINALSYSI DIPSTICK (AUTOMATED)
Bilirubin, UA: NEGATIVE
Blood, UA: NEGATIVE
Glucose, UA: NEGATIVE
KETONES UA: NEGATIVE
LEUKOCYTES UA: NEGATIVE
Nitrite, UA: NEGATIVE
PROTEIN UA: NEGATIVE
Spec Grav, UA: 1.01
UROBILINOGEN UA: 0.2
pH, UA: 6

## 2015-06-25 LAB — AMYLASE: Amylase: 68 U/L (ref 27–131)

## 2015-06-25 LAB — LIPASE: Lipase: 61 U/L — ABNORMAL HIGH (ref 11.0–59.0)

## 2015-06-25 MED ORDER — RANITIDINE HCL 150 MG PO CAPS: 150.0000 mg | ORAL_CAPSULE | Freq: Two times a day (BID) | ORAL | Status: DC

## 2015-06-25 NOTE — Telephone Encounter (Signed)
Dr. Larose Kells. Seeing your pt for abd pain. Pt never had colonsocpy. Pt not sure if had cologuard.. Where would that be if she did have that done?

## 2015-06-25 NOTE — Addendum Note (Signed)
Addended by: Tasia Catchings on: 06/25/2015 12:03 PM   Modules accepted: Orders

## 2015-06-25 NOTE — Patient Instructions (Addendum)
For your abdomen pain will rx ranitidine.  Will get cbc, cmp, lipase, amylase and h pylori.  Will get Abd [place] at 2:30 this afternoon.  If pain increases or changes over weekend and severe then ED evaluation.   Follow up in 7 days or as needed

## 2015-06-25 NOTE — Telephone Encounter (Signed)
Would you ask pt to schedule appointment with Dr. Larose Kells for complete physical. He can discuss getting colonscopy and update maintenance.

## 2015-06-25 NOTE — Telephone Encounter (Signed)
Future stat place to be done this coming Friday.

## 2015-06-25 NOTE — Progress Notes (Signed)
Subjective:    Patient ID: Tammy Long, female    DOB: Jul 12, 1955, 60 y.o.   MRN: [id number]  HPI  Pt in with some abdomen pain for about one month. Pt states has occasional sharp pain to left of umbilcus. This occurrs on an off. In the past would have occasional pain in this area with movements.  Pt notes this pain occurred often during PT(decsribes when abdomen muscles flexed or increased intrabdominal pressure)   Also with sharp pain region she also has some diffuse abdomen pain at times as well. Diffuse pain for 2-3 weeks. Pt does not associate diffuse pain or sharp pain with eating. Pt denies diarrhea or constipation. Pt last bowel movement this morning and normal. No nausea or vomiting with pain. No black or bloody stools. No fever, no chills or sweats. No dysuria. No back pain that she associates with abdomen pain. Pt does not belch a lot. Sometimes carbonated water upsets stomach. This morning noted passed gas and felt little better.(then clarifies mild upset diffuse pain after eating in epigastric area)   Review of Systems  Constitutional: Negative for fever, chills and fatigue.  Respiratory: Negative for cough, chest tightness, shortness of breath and wheezing.   Cardiovascular: Negative for chest pain and palpitations.  Gastrointestinal: Positive for abdominal pain.  Musculoskeletal:       No back pain with abdomen pain. But some lower lumbar area in past from fall down steps. That was in [place]. She went to PT for that.  Neurological: Negative for dizziness, seizures and headaches.  Hematological: Negative for adenopathy. Does not bruise/bleed easily.  Psychiatric/Behavioral: Negative for behavioral problems and confusion.    Past Medical History  Diagnosis Date  . Anxiety   . PONV (postoperative nausea and vomiting)   . Asthma   . Anesthesia complication     hard to wake up, amnesia      Social History   Social History  . Marital Status: Married    Spouse  Name: N/A  . Number of Children: 2  . Years of Education: N/A   Occupational History  . owns a business     Social History Main Topics  . Smoking status: Never Smoker   . Smokeless tobacco: Never Used  . Alcohol Use: No  . Drug Use: No  . Sexual Activity: Not on file   Other Topics Concern  . Not on file   Social History Narrative    Past Surgical History  Procedure Laterality Date  . Cesarean section      x 2  . Hernia repair    . Salpingoophorectomy  05/01/2011    Procedure: SALPINGO OOPHERECTOMY;  Surgeon: Maeola Sarah. Landry Mellow, MD;  Location: [facility];  Service: Gynecology;  Laterality: Bilateral;  . Abdominal hysterectomy  04/2011    No family history on file.  Allergies  Allergen Reactions  . Influenza Vaccines     Aches, fevers x 3-4 days   . Meperidine Hcl Rash    Current Outpatient Prescriptions on File Prior to Visit  Medication Sig Dispense Refill  . acetaminophen (TYLENOL) 500 MG tablet Take 500 mg by mouth as needed. Headache or pain    . albuterol (PROVENTIL HFA;VENTOLIN HFA) 108 (90 BASE) MCG/ACT inhaler Inhale 2 puffs into the lungs every 6 (six) hours as needed for wheezing. 1 Inhaler 3  . BIOTIN PO Take 35,000 mcg by mouth daily.    . Calcium-Vitamin D-Vitamin K (CALCIUM + D + K PO) Take 1 tablet  by mouth daily. Calcium, Vit D, Vit K  500mg /200units/75mcg    . cetirizine (ZYRTEC) 10 MG tablet Take 10 mg by mouth daily.    . fluticasone (FLONASE) 50 MCG/ACT nasal spray Place 2 sprays into both nostrils daily. 16 g 12  . HYDROcodone-acetaminophen (NORCO) 10-325 MG per tablet Take 1 tablet by mouth every 8 (eight) hours as needed. 30 tablet 0  . sertraline (ZOLOFT) 50 MG tablet Take 1.5 tablets (75 mg total) by mouth daily. (Patient taking differently: Take 25 mg by mouth daily. Patient is Weaning off of Medication, currently Takes 25 mg Daily) 60 tablet 2  . [DISCONTINUED] medroxyPROGESTERone (PROVERA) 10 MG tablet Take 10 mg by mouth daily. Stopped 2/21     No  current facility-administered medications on file prior to visit.    BP 116/64 mmHg  Pulse 77  Temp(Src) 98.3 F (36.8 C) (Oral)  Ht 5\' 2"  (1.575 m)  Wt 120 lb 3.2 oz (54.522 kg)  BMI 21.98 kg/m2  SpO2 98%  LMP 04/03/2011       Objective:   Physical Exam  General Appearance- Not in acute distress.  HEENT Eyes- Scleraeral/Conjuntiva-bilat- Not Yellow. Mouth & Throat- Normal.  Chest and Lung Exam Auscultation: Breath sounds:-Normal. Adventitious sounds:- No Adventitious sounds.  Cardiovascular Auscultation:Rythm - Regular. Heart Sounds -Normal heart sounds.  Abdomen Inspection:-Inspection Normal.  Palpation/Perucssion: Palpation and Percussion of the abdomen reveal- Non Tender on exam, No Rebound tenderness, No rigidity(Guarding) and No Palpable abdominal masses.  Liver:-Normal.  Spleen:- Normal.         Assessment & Plan:  For your abdomen pain will rx ranitidine.  Will get cbc, cmp, lipase, amylase and h pylori.  Will get Abd [place] at 2:30 this afternoon.  If pain increases or changes over weekend and severe then ED evaluation.  Jalil Lorusso, Percell Miller, PA-C

## 2015-06-25 NOTE — Telephone Encounter (Signed)
Please have the pt come in for a physical with Dr. Larose Kells.

## 2015-06-25 NOTE — Telephone Encounter (Signed)
She never had a colonoscopy to my knowledge. cologuard is an alternative for colon cancer screening but is not a tool to investigate abdominal pain. Suggest office visit for a CPX at her earliest convenience

## 2015-06-25 NOTE — Telephone Encounter (Signed)
Ok will ask Barnet Pall to tell her to come in for cpe. I usually review colonnscopy history  in event something comes back later.

## 2015-06-25 NOTE — Progress Notes (Signed)
Pre visit review using our clinic review tool, if applicable. No additional management support is needed unless otherwise documented below in the visit note. 

## 2015-06-28 LAB — H. PYLORI BREATH TEST: H. PYLORI BREATH TEST: NOT DETECTED

## 2015-06-30 ENCOUNTER — Encounter: Payer: Self-pay | Admitting: Medical

## 2015-07-08 NOTE — Telephone Encounter (Signed)
Patient scheduled for 07/12/15 at 3pm

## 2015-07-12 ENCOUNTER — Encounter: Payer: BLUE CROSS/BLUE SHIELD | Admitting: Internal Medicine

## 2015-07-16 ENCOUNTER — Other Ambulatory Visit (INDEPENDENT_AMBULATORY_CARE_PROVIDER_SITE_OTHER): Payer: BLUE CROSS/BLUE SHIELD

## 2015-07-16 DIAGNOSIS — R748 Abnormal levels of other serum enzymes: Secondary | ICD-10-CM

## 2015-07-16 LAB — AMYLASE: AMYLASE: 59 U/L (ref 27–131)

## 2015-07-16 LAB — LIPASE: LIPASE: 56 U/L (ref 11.0–59.0)

## 2015-07-22 ENCOUNTER — Telehealth: Payer: Self-pay | Admitting: Behavioral Health

## 2015-07-22 ENCOUNTER — Encounter: Payer: Self-pay | Admitting: Behavioral Health

## 2015-07-22 NOTE — Telephone Encounter (Signed)
Pre-Visit Call completed with patient and chart updated.   Pre-Visit Info documented in Specialty Comments under SnapShot.    

## 2015-07-23 ENCOUNTER — Encounter: Payer: BLUE CROSS/BLUE SHIELD | Admitting: Internal Medicine

## 2015-07-23 ENCOUNTER — Telehealth: Payer: Self-pay | Admitting: Internal Medicine

## 2015-07-23 DIAGNOSIS — Z0289 Encounter for other administrative examinations: Secondary | ICD-10-CM

## 2015-07-27 NOTE — Telephone Encounter (Signed)
Pt was no show 07/23/15 for cpe, 2nd no show + 3 cancellations, pt has not rescheduled, charge or no charge?

## 2015-07-27 NOTE — Telephone Encounter (Signed)
Okay to charge.  Will   consider the dismissal process if he continue to no show

## 2015-07-27 NOTE — Telephone Encounter (Signed)
Please advise. Would you like to begin dismissal process?

## 2015-07-28 ENCOUNTER — Encounter: Payer: Self-pay | Admitting: Internal Medicine

## 2015-07-28 NOTE — Telephone Encounter (Signed)
Marked to charge and mailing no show letter °

## 2015-08-03 DIAGNOSIS — M542 Cervicalgia: Secondary | ICD-10-CM | POA: Diagnosis not present

## 2015-08-03 DIAGNOSIS — M546 Pain in thoracic spine: Secondary | ICD-10-CM | POA: Diagnosis not present

## 2015-08-06 ENCOUNTER — Telehealth: Payer: Self-pay | Admitting: Internal Medicine

## 2015-08-06 NOTE — Telephone Encounter (Signed)
Pt sent msgs thru my chart appt request. ° ° ° >','<< Less Detail',event)" href="javascript:;">More Detail >>  °  RE: Appointment Request  °  Tully P Kreischer  °  Sent: Mon August 02, 2015  3:47 PM  °  To: [facility] Pool  °   Nuvia P Noone  °  MRN: [id number] DOB: 03/31/1955  °  Pt Home: [phone]    °  Entered: [phone]    °  °Message   °  My stomach is feeling better. If Dr. Paz feels I need a colonoscopy I will consider it. I don't want to go the the one in [place] by [address]. When I went there they had me scheduled for another procedure. They did not have their act together.  °    °  I do have a hemoccuit kit that I can do and send in if he would like that first.  °    °  Thank you,  °    °  Kathy Nicodemus  °  ----- Message -----  °  From: Tiffany M B  °  Sent: 07/27/2015  1:17 PM EDT  °  To: Siena P Bartolini  °  Subject: RE: Appointment Request  °    °  Dr. Paz next available physical appointment is not until 10/22/15 at 3pm. Regarding your stomach issues and colonoscopy order you can schedule an acute appointment as soon as possible  °    °  ----- Message -----  °     From: Kushnir,Yaileen P  °     Sent: 07/27/2015 12:25 PM EDT  °       To: Patient Appointment Schedule Request Mailing List  °  Subject: Appointment Request  °    °  Appointment Request From: Maitland P Denner  °    °  With Provider: Jose Paz, MD [[facility]]  °    °  Preferred Date Range: From 08/13/2015 To 08/27/2015  °    °  Reason for visit: Annual Physical  °    °  Comments:  °  Hi I am very sorry I had to cancel my last appointment so abruptly.  I was disappointed I had to miss it. I had a major event happen at work that I had to handle right then. I did leave a message on the recording since I didn't have the opportunity to speak with someone on the phone.  °    °  I would like to reschedule with Dr. Paz for my physical and to discuss setting up a colonoscopy and some of the stomach  issues I have been having.  I will be on vacation June 24th-July 1st. Please let me know when you have an open appointment. My cell number is [phone].  °    °  Sincerely,  °    °  Kathy Vecchio  °   ° ° °

## 2015-08-06 NOTE — Telephone Encounter (Signed)
Please schedule CPE appt at Pt's convenience. Cscope can be discussed during visit. Thank you.

## 2015-08-10 DIAGNOSIS — M546 Pain in thoracic spine: Secondary | ICD-10-CM | POA: Diagnosis not present

## 2015-08-10 DIAGNOSIS — M5023 Other cervical disc displacement, cervicothoracic region: Secondary | ICD-10-CM | POA: Diagnosis not present

## 2015-08-10 DIAGNOSIS — M542 Cervicalgia: Secondary | ICD-10-CM | POA: Diagnosis not present

## 2015-08-25 DIAGNOSIS — Z01419 Encounter for gynecological examination (general) (routine) without abnormal findings: Secondary | ICD-10-CM | POA: Diagnosis not present

## 2015-08-25 DIAGNOSIS — M5023 Other cervical disc displacement, cervicothoracic region: Secondary | ICD-10-CM | POA: Diagnosis not present

## 2015-08-25 DIAGNOSIS — Z90710 Acquired absence of both cervix and uterus: Secondary | ICD-10-CM | POA: Diagnosis not present

## 2015-08-25 DIAGNOSIS — M542 Cervicalgia: Secondary | ICD-10-CM | POA: Diagnosis not present

## 2015-08-25 DIAGNOSIS — Z79890 Hormone replacement therapy (postmenopausal): Secondary | ICD-10-CM | POA: Diagnosis not present

## 2015-08-25 DIAGNOSIS — M546 Pain in thoracic spine: Secondary | ICD-10-CM | POA: Diagnosis not present

## 2015-08-30 ENCOUNTER — Ambulatory Visit: Payer: BLUE CROSS/BLUE SHIELD | Admitting: Internal Medicine

## 2015-08-30 DIAGNOSIS — M5023 Other cervical disc displacement, cervicothoracic region: Secondary | ICD-10-CM | POA: Diagnosis not present

## 2015-08-30 DIAGNOSIS — M545 Low back pain: Secondary | ICD-10-CM | POA: Diagnosis not present

## 2015-08-30 DIAGNOSIS — S39012D Strain of muscle, fascia and tendon of lower back, subsequent encounter: Secondary | ICD-10-CM | POA: Diagnosis not present

## 2015-08-30 DIAGNOSIS — M542 Cervicalgia: Secondary | ICD-10-CM | POA: Diagnosis not present

## 2015-09-02 DIAGNOSIS — M5023 Other cervical disc displacement, cervicothoracic region: Secondary | ICD-10-CM | POA: Diagnosis not present

## 2015-09-02 DIAGNOSIS — M546 Pain in thoracic spine: Secondary | ICD-10-CM | POA: Diagnosis not present

## 2015-09-02 DIAGNOSIS — M542 Cervicalgia: Secondary | ICD-10-CM | POA: Diagnosis not present

## 2015-09-09 DIAGNOSIS — M546 Pain in thoracic spine: Secondary | ICD-10-CM | POA: Diagnosis not present

## 2015-09-09 DIAGNOSIS — M5023 Other cervical disc displacement, cervicothoracic region: Secondary | ICD-10-CM | POA: Diagnosis not present

## 2015-09-09 DIAGNOSIS — M542 Cervicalgia: Secondary | ICD-10-CM | POA: Diagnosis not present

## 2015-09-28 DIAGNOSIS — M542 Cervicalgia: Secondary | ICD-10-CM | POA: Diagnosis not present

## 2015-10-12 DIAGNOSIS — M5023 Other cervical disc displacement, cervicothoracic region: Secondary | ICD-10-CM | POA: Diagnosis not present

## 2015-10-12 DIAGNOSIS — M546 Pain in thoracic spine: Secondary | ICD-10-CM | POA: Diagnosis not present

## 2015-10-12 DIAGNOSIS — M542 Cervicalgia: Secondary | ICD-10-CM | POA: Diagnosis not present

## 2015-11-02 DIAGNOSIS — M542 Cervicalgia: Secondary | ICD-10-CM | POA: Diagnosis not present

## 2015-11-24 ENCOUNTER — Encounter: Payer: Self-pay | Admitting: Medical

## 2015-11-24 DIAGNOSIS — M5410 Radiculopathy, site unspecified: Secondary | ICD-10-CM

## 2015-11-24 DIAGNOSIS — M542 Cervicalgia: Secondary | ICD-10-CM

## 2015-12-13 DIAGNOSIS — S29012A Strain of muscle and tendon of back wall of thorax, initial encounter: Secondary | ICD-10-CM | POA: Diagnosis not present

## 2015-12-13 DIAGNOSIS — M4802 Spinal stenosis, cervical region: Secondary | ICD-10-CM | POA: Diagnosis not present

## 2015-12-13 DIAGNOSIS — G4700 Insomnia, unspecified: Secondary | ICD-10-CM | POA: Diagnosis not present

## 2015-12-13 DIAGNOSIS — F419 Anxiety disorder, unspecified: Secondary | ICD-10-CM | POA: Diagnosis not present

## 2015-12-14 DIAGNOSIS — G4700 Insomnia, unspecified: Secondary | ICD-10-CM | POA: Insufficient documentation

## 2015-12-15 ENCOUNTER — Encounter: Payer: Self-pay | Admitting: Internal Medicine

## 2015-12-28 DIAGNOSIS — M546 Pain in thoracic spine: Secondary | ICD-10-CM | POA: Diagnosis not present

## 2015-12-28 DIAGNOSIS — M542 Cervicalgia: Secondary | ICD-10-CM | POA: Diagnosis not present

## 2016-01-07 ENCOUNTER — Encounter: Payer: Self-pay | Admitting: Medical

## 2016-01-10 ENCOUNTER — Telehealth: Payer: Self-pay | Admitting: Internal Medicine

## 2016-01-10 DIAGNOSIS — Z1211 Encounter for screening for malignant neoplasm of colon: Secondary | ICD-10-CM

## 2016-01-10 NOTE — Telephone Encounter (Signed)
GI referral placed to [place].

## 2016-01-10 NOTE — Telephone Encounter (Signed)
Patient sent a request that she would like to get a colon screening done as she has never had one and feels as though she is due. She stated that she would NOT like to go to the office off of [address] in [place]. Please advise.   Patient phone: [phone]

## 2016-01-11 NOTE — Telephone Encounter (Signed)
Pt want to having screening colonoscopy before the end of the year. She mentioned in note not at [facility] [address] [facility]. So will you call her and clarify where she wants to go. Looks like order was already placed.

## 2016-01-12 DIAGNOSIS — M546 Pain in thoracic spine: Secondary | ICD-10-CM | POA: Diagnosis not present

## 2016-01-12 DIAGNOSIS — M542 Cervicalgia: Secondary | ICD-10-CM | POA: Diagnosis not present

## 2016-01-12 DIAGNOSIS — M5023 Other cervical disc displacement, cervicothoracic region: Secondary | ICD-10-CM | POA: Diagnosis not present

## 2016-01-20 DIAGNOSIS — M542 Cervicalgia: Secondary | ICD-10-CM | POA: Diagnosis not present

## 2016-02-01 DIAGNOSIS — M5023 Other cervical disc displacement, cervicothoracic region: Secondary | ICD-10-CM | POA: Diagnosis not present

## 2016-02-01 DIAGNOSIS — M542 Cervicalgia: Secondary | ICD-10-CM | POA: Diagnosis not present

## 2016-02-01 DIAGNOSIS — M546 Pain in thoracic spine: Secondary | ICD-10-CM | POA: Diagnosis not present

## 2016-02-23 DIAGNOSIS — M546 Pain in thoracic spine: Secondary | ICD-10-CM | POA: Diagnosis not present

## 2016-02-23 DIAGNOSIS — M5023 Other cervical disc displacement, cervicothoracic region: Secondary | ICD-10-CM | POA: Diagnosis not present

## 2016-02-23 DIAGNOSIS — M542 Cervicalgia: Secondary | ICD-10-CM | POA: Diagnosis not present

## 2016-02-29 ENCOUNTER — Encounter: Payer: Self-pay | Admitting: Internal Medicine

## 2016-02-29 DIAGNOSIS — A6004 Herpesviral vulvovaginitis: Secondary | ICD-10-CM | POA: Insufficient documentation

## 2016-02-29 NOTE — Telephone Encounter (Signed)
See patient's message, needs office visit before referral  Received: Today  [place], MD  Damita Dunnings, CMA   MyChart message sent to Pt.

## 2016-03-07 DIAGNOSIS — M546 Pain in thoracic spine: Secondary | ICD-10-CM | POA: Diagnosis not present

## 2016-03-07 DIAGNOSIS — M5023 Other cervical disc displacement, cervicothoracic region: Secondary | ICD-10-CM | POA: Diagnosis not present

## 2016-03-07 DIAGNOSIS — M542 Cervicalgia: Secondary | ICD-10-CM | POA: Diagnosis not present

## 2016-03-23 DIAGNOSIS — M542 Cervicalgia: Secondary | ICD-10-CM | POA: Diagnosis not present

## 2016-03-31 DIAGNOSIS — M542 Cervicalgia: Secondary | ICD-10-CM | POA: Diagnosis not present

## 2016-04-05 DIAGNOSIS — M542 Cervicalgia: Secondary | ICD-10-CM | POA: Diagnosis not present

## 2016-04-05 DIAGNOSIS — L821 Other seborrheic keratosis: Secondary | ICD-10-CM | POA: Diagnosis not present

## 2016-04-05 DIAGNOSIS — M5023 Other cervical disc displacement, cervicothoracic region: Secondary | ICD-10-CM | POA: Diagnosis not present

## 2016-04-05 DIAGNOSIS — M546 Pain in thoracic spine: Secondary | ICD-10-CM | POA: Diagnosis not present

## 2016-04-05 DIAGNOSIS — B351 Tinea unguium: Secondary | ICD-10-CM | POA: Diagnosis not present

## 2016-04-05 DIAGNOSIS — L908 Other atrophic disorders of skin: Secondary | ICD-10-CM | POA: Diagnosis not present

## 2016-04-05 DIAGNOSIS — L658 Other specified nonscarring hair loss: Secondary | ICD-10-CM | POA: Diagnosis not present

## 2016-04-06 DIAGNOSIS — G4700 Insomnia, unspecified: Secondary | ICD-10-CM | POA: Diagnosis not present

## 2016-04-06 DIAGNOSIS — F419 Anxiety disorder, unspecified: Secondary | ICD-10-CM | POA: Diagnosis not present

## 2016-04-06 DIAGNOSIS — M4802 Spinal stenosis, cervical region: Secondary | ICD-10-CM | POA: Diagnosis not present

## 2016-04-06 DIAGNOSIS — S29012D Strain of muscle and tendon of back wall of thorax, subsequent encounter: Secondary | ICD-10-CM | POA: Diagnosis not present

## 2016-05-05 DIAGNOSIS — Z1231 Encounter for screening mammogram for malignant neoplasm of breast: Secondary | ICD-10-CM | POA: Diagnosis not present

## 2016-05-08 LAB — HM MAMMOGRAPHY

## 2016-06-22 DIAGNOSIS — M542 Cervicalgia: Secondary | ICD-10-CM | POA: Diagnosis not present

## 2016-06-22 DIAGNOSIS — M50221 Other cervical disc displacement at C4-C5 level: Secondary | ICD-10-CM | POA: Diagnosis not present

## 2016-06-22 DIAGNOSIS — M791 Myalgia: Secondary | ICD-10-CM | POA: Diagnosis not present

## 2016-06-22 DIAGNOSIS — M6281 Muscle weakness (generalized): Secondary | ICD-10-CM | POA: Diagnosis not present

## 2016-06-22 DIAGNOSIS — M50321 Other cervical disc degeneration at C4-C5 level: Secondary | ICD-10-CM | POA: Diagnosis not present

## 2016-06-23 DIAGNOSIS — M50221 Other cervical disc displacement at C4-C5 level: Secondary | ICD-10-CM | POA: Diagnosis not present

## 2016-06-23 DIAGNOSIS — M6281 Muscle weakness (generalized): Secondary | ICD-10-CM | POA: Diagnosis not present

## 2016-06-23 DIAGNOSIS — M50321 Other cervical disc degeneration at C4-C5 level: Secondary | ICD-10-CM | POA: Diagnosis not present

## 2016-06-23 DIAGNOSIS — M791 Myalgia: Secondary | ICD-10-CM | POA: Diagnosis not present

## 2016-06-23 DIAGNOSIS — M542 Cervicalgia: Secondary | ICD-10-CM | POA: Diagnosis not present

## 2016-06-28 DIAGNOSIS — M6281 Muscle weakness (generalized): Secondary | ICD-10-CM | POA: Diagnosis not present

## 2016-06-28 DIAGNOSIS — M542 Cervicalgia: Secondary | ICD-10-CM | POA: Diagnosis not present

## 2016-06-28 DIAGNOSIS — M50321 Other cervical disc degeneration at C4-C5 level: Secondary | ICD-10-CM | POA: Diagnosis not present

## 2016-06-28 DIAGNOSIS — M50221 Other cervical disc displacement at C4-C5 level: Secondary | ICD-10-CM | POA: Diagnosis not present

## 2016-06-28 DIAGNOSIS — M791 Myalgia: Secondary | ICD-10-CM | POA: Diagnosis not present

## 2016-06-30 DIAGNOSIS — M791 Myalgia: Secondary | ICD-10-CM | POA: Diagnosis not present

## 2016-06-30 DIAGNOSIS — M50321 Other cervical disc degeneration at C4-C5 level: Secondary | ICD-10-CM | POA: Diagnosis not present

## 2016-06-30 DIAGNOSIS — M542 Cervicalgia: Secondary | ICD-10-CM | POA: Diagnosis not present

## 2016-06-30 DIAGNOSIS — M50221 Other cervical disc displacement at C4-C5 level: Secondary | ICD-10-CM | POA: Diagnosis not present

## 2016-06-30 DIAGNOSIS — M6281 Muscle weakness (generalized): Secondary | ICD-10-CM | POA: Diagnosis not present

## 2016-07-04 DIAGNOSIS — M791 Myalgia: Secondary | ICD-10-CM | POA: Diagnosis not present

## 2016-07-04 DIAGNOSIS — M50321 Other cervical disc degeneration at C4-C5 level: Secondary | ICD-10-CM | POA: Diagnosis not present

## 2016-07-04 DIAGNOSIS — M6281 Muscle weakness (generalized): Secondary | ICD-10-CM | POA: Diagnosis not present

## 2016-07-04 DIAGNOSIS — M50221 Other cervical disc displacement at C4-C5 level: Secondary | ICD-10-CM | POA: Diagnosis not present

## 2016-07-04 DIAGNOSIS — M542 Cervicalgia: Secondary | ICD-10-CM | POA: Diagnosis not present

## 2016-07-07 DIAGNOSIS — M6281 Muscle weakness (generalized): Secondary | ICD-10-CM | POA: Diagnosis not present

## 2016-07-07 DIAGNOSIS — M542 Cervicalgia: Secondary | ICD-10-CM | POA: Diagnosis not present

## 2016-07-07 DIAGNOSIS — M791 Myalgia: Secondary | ICD-10-CM | POA: Diagnosis not present

## 2016-07-07 DIAGNOSIS — M50221 Other cervical disc displacement at C4-C5 level: Secondary | ICD-10-CM | POA: Diagnosis not present

## 2016-07-07 DIAGNOSIS — M50321 Other cervical disc degeneration at C4-C5 level: Secondary | ICD-10-CM | POA: Diagnosis not present

## 2016-07-11 DIAGNOSIS — M542 Cervicalgia: Secondary | ICD-10-CM | POA: Diagnosis not present

## 2016-07-11 DIAGNOSIS — M791 Myalgia: Secondary | ICD-10-CM | POA: Diagnosis not present

## 2016-07-11 DIAGNOSIS — M50321 Other cervical disc degeneration at C4-C5 level: Secondary | ICD-10-CM | POA: Diagnosis not present

## 2016-07-11 DIAGNOSIS — M6281 Muscle weakness (generalized): Secondary | ICD-10-CM | POA: Diagnosis not present

## 2016-07-11 DIAGNOSIS — M50221 Other cervical disc displacement at C4-C5 level: Secondary | ICD-10-CM | POA: Diagnosis not present

## 2016-07-18 DIAGNOSIS — M791 Myalgia: Secondary | ICD-10-CM | POA: Diagnosis not present

## 2016-07-18 DIAGNOSIS — M50321 Other cervical disc degeneration at C4-C5 level: Secondary | ICD-10-CM | POA: Diagnosis not present

## 2016-07-18 DIAGNOSIS — M6281 Muscle weakness (generalized): Secondary | ICD-10-CM | POA: Diagnosis not present

## 2016-07-18 DIAGNOSIS — M50221 Other cervical disc displacement at C4-C5 level: Secondary | ICD-10-CM | POA: Diagnosis not present

## 2016-07-18 DIAGNOSIS — M542 Cervicalgia: Secondary | ICD-10-CM | POA: Diagnosis not present

## 2016-07-21 DIAGNOSIS — M791 Myalgia: Secondary | ICD-10-CM | POA: Diagnosis not present

## 2016-07-21 DIAGNOSIS — M50321 Other cervical disc degeneration at C4-C5 level: Secondary | ICD-10-CM | POA: Diagnosis not present

## 2016-07-21 DIAGNOSIS — M6281 Muscle weakness (generalized): Secondary | ICD-10-CM | POA: Diagnosis not present

## 2016-07-21 DIAGNOSIS — M50221 Other cervical disc displacement at C4-C5 level: Secondary | ICD-10-CM | POA: Diagnosis not present

## 2016-07-24 DIAGNOSIS — M50221 Other cervical disc displacement at C4-C5 level: Secondary | ICD-10-CM | POA: Diagnosis not present

## 2016-07-24 DIAGNOSIS — M50321 Other cervical disc degeneration at C4-C5 level: Secondary | ICD-10-CM | POA: Diagnosis not present

## 2016-07-24 DIAGNOSIS — M791 Myalgia: Secondary | ICD-10-CM | POA: Diagnosis not present

## 2016-07-24 DIAGNOSIS — M6281 Muscle weakness (generalized): Secondary | ICD-10-CM | POA: Diagnosis not present

## 2016-08-02 DIAGNOSIS — M791 Myalgia: Secondary | ICD-10-CM | POA: Diagnosis not present

## 2016-08-02 DIAGNOSIS — M6281 Muscle weakness (generalized): Secondary | ICD-10-CM | POA: Diagnosis not present

## 2016-08-02 DIAGNOSIS — M50321 Other cervical disc degeneration at C4-C5 level: Secondary | ICD-10-CM | POA: Diagnosis not present

## 2016-08-02 DIAGNOSIS — M50221 Other cervical disc displacement at C4-C5 level: Secondary | ICD-10-CM | POA: Diagnosis not present

## 2016-08-16 DIAGNOSIS — M50221 Other cervical disc displacement at C4-C5 level: Secondary | ICD-10-CM | POA: Diagnosis not present

## 2016-08-16 DIAGNOSIS — M6281 Muscle weakness (generalized): Secondary | ICD-10-CM | POA: Diagnosis not present

## 2016-08-16 DIAGNOSIS — M50321 Other cervical disc degeneration at C4-C5 level: Secondary | ICD-10-CM | POA: Diagnosis not present

## 2016-08-16 DIAGNOSIS — M791 Myalgia: Secondary | ICD-10-CM | POA: Diagnosis not present

## 2016-08-29 ENCOUNTER — Ambulatory Visit (INDEPENDENT_AMBULATORY_CARE_PROVIDER_SITE_OTHER): Payer: BLUE CROSS/BLUE SHIELD | Admitting: Internal Medicine

## 2016-08-29 ENCOUNTER — Encounter: Payer: Self-pay | Admitting: Internal Medicine

## 2016-08-29 VITALS — BP 126/64 | HR 74 | Temp 98.1°F | Resp 14 | Ht 62.0 in | Wt 120.0 lb

## 2016-08-29 DIAGNOSIS — Z23 Encounter for immunization: Secondary | ICD-10-CM

## 2016-08-29 DIAGNOSIS — Z0000 Encounter for general adult medical examination without abnormal findings: Secondary | ICD-10-CM

## 2016-08-29 DIAGNOSIS — Z1159 Encounter for screening for other viral diseases: Secondary | ICD-10-CM

## 2016-08-29 DIAGNOSIS — Z1211 Encounter for screening for malignant neoplasm of colon: Secondary | ICD-10-CM

## 2016-08-29 DIAGNOSIS — Z114 Encounter for screening for human immunodeficiency virus [HIV]: Secondary | ICD-10-CM

## 2016-08-29 MED ORDER — ZOSTER VAC RECOMB ADJUVANTED 50 MCG/0.5ML IM SUSR: 0.5000 mL | Freq: Once | INTRAMUSCULAR | 1 refills | Status: AC

## 2016-08-29 NOTE — Progress Notes (Signed)
Pre visit review using our clinic review tool, if applicable. No additional management support is needed unless otherwise documented below in the visit note. 

## 2016-08-29 NOTE — Progress Notes (Signed)
Subjective:    Patient ID: Tammy Long, female    DOB: Jan 26, 1956, 61 y.o.   MRN: [id number]  DOS:  08/29/2016 Type of visit - description : CPX Interval history:  No major concerns  Review of Systems  Anxiety controlled at this time. Has upper back pain, neck pain, well-controlled at this time.  Other than above, a 14 point review of systems is negative    Past Medical History:  Diagnosis Date  . Anesthesia complication    hard to wake up, amnesia   . Anxiety   . Asthma   . PONV (postoperative nausea and vomiting)     Past Surgical History:  Procedure Laterality Date  . ABDOMINAL HYSTERECTOMY  04/2011  . CESAREAN SECTION     x 2  . HERNIA REPAIR    . SALPINGOOPHORECTOMY  05/01/2011   Procedure: SALPINGO OOPHERECTOMY;  Surgeon: Maeola Sarah. Landry Mellow, MD;  Location: [facility];  Service: Gynecology;  Laterality: Bilateral;    Social History   Social History  . Marital status: Married    Spouse name: N/A  . Number of children: 2  . Years of education: N/A   Occupational History  . owns a business     Social History Main Topics  . Smoking status: Never Smoker  . Smokeless tobacco: Never Used  . Alcohol use No  . Drug use: No  . Sexual activity: Not on file   Other Topics Concern  . Not on file   Social History Narrative  . No narrative on file     Family History  Problem Relation Age of Onset  . CAD Father        MI x 2, first at ge 77  . Diabetes Neg Hx   . Breast cancer Neg Hx   . Colon cancer Neg Hx      Allergies as of 08/29/2016      Reactions   Influenza Vaccines    Aches, fevers x 3-4 days    Meperidine Hcl Rash      Medication List       Accurate as of 08/29/16 11:59 PM. Always use your most recent med list.          acetaminophen 500 MG tablet Commonly known as:  TYLENOL Take 500 mg by mouth as needed. Headache or pain   albuterol 108 (90 Base) MCG/ACT inhaler Commonly known as:  PROVENTIL HFA;VENTOLIN HFA Inhale 2 puffs into the  lungs every 6 (six) hours as needed for wheezing.   BIOTIN PO Take 35,000 mcg by mouth daily.   BROMELAIN PO Take 450 mg by mouth 3 (three) times daily.   CALCIUM + D + K PO Take 1 tablet by mouth daily. Calcium, Vit D, Vit K  500mg /200units/27mcg   cetirizine 10 MG tablet Commonly known as:  ZYRTEC Take 10 mg by mouth daily.   desvenlafaxine 50 MG 24 hr tablet Commonly known as:  PRISTIQ Take 50 mg by mouth daily.   estradiol 0.05 MG/24HR patch Commonly known as:  VIVELLE-DOT Place 1 patch onto the skin 2 (two) times a week.   fluticasone 50 MCG/ACT nasal spray Commonly known as:  FLONASE Place 2 sprays into both nostrils daily.   Turmeric 450 MG Caps Take 450 mg by mouth 3 (three) times daily.   vitamin C 1000 MG tablet Take 1,000 mg by mouth 3 (three) times daily.   Zoster Vac Recomb Adjuvanted injection Commonly known as:  SHINGRIX Inject 0.5 mLs into  the muscle once.          Objective:   Physical Exam BP 126/64 (BP Location: Left Arm, Patient Position: Sitting, Cuff Size: Normal)   Pulse 74   Temp 98.1 F (36.7 C) (Oral)   Resp 14   Ht 5\' 2"  (1.575 m)   Wt 120 lb (54.4 kg)   LMP 04/03/2011   SpO2 97%   BMI 21.95 kg/m   General:   Well developed, well nourished . NAD.  Neck: No  thyromegaly  HEENT:  Normocephalic . Face symmetric, atraumatic Lungs:  CTA B Normal respiratory effort, no intercostal retractions, no accessory muscle use. Heart: RRR,  no murmur.  No pretibial edema bilaterally  Abdomen:  Not distended, soft, non-tender. No rebound or rigidity.   Skin: Exposed areas without rash. Not pale. Not jaundice Neurologic:  alert & oriented X3.  Speech normal, gait appropriate for age and unassisted Strength symmetric and appropriate for age.  Psych: Cognition and judgment appear intact.  Cooperative with normal attention span and concentration.  Behavior appropriate. No anxious or depressed appearing.    Assessment & Plan:    Assessment  Asthma, allergies Anxiety [facility], neck pain, upper back,  Dr Everette Rank, chiropractor; used to see  Dr. Len Childs, ortho;  PLAN Asthma, allergies: Well-controlled with antihistaminics, very rarely uses an inhaler. Anxiety: On Pristiq,controlled this time. We talk about possibly d/c the medication but things are doing okay. No change Neck pain: sees neurology, visits the chiropractor as needed. Last neurology visit 04/06/2016, they recommend conservative treatment. RTC one year

## 2016-08-29 NOTE — Patient Instructions (Signed)
   GO TO THE FRONT DESK Schedule labs to be done fasting at your earliest convenience  Schedule your next appointment for a  physical exam in one year

## 2016-08-29 NOTE — Assessment & Plan Note (Addendum)
-  Td 08-2016; shinglex rx printed, benefits and s/e d/w pt. Patient wonder about a pneumonia shot, too early for her. - CCS: never had cscope, request a GI referral, likes to see somebody in [place],  she has h/o a severe anesthesia reaction  -female care: per [facility], had a [facility] this year - labs : Will come back fasting: CMP, FLP, CBC, TSH, hep C, HIV -diet, exercise : reports is doing well

## 2016-08-30 ENCOUNTER — Other Ambulatory Visit (INDEPENDENT_AMBULATORY_CARE_PROVIDER_SITE_OTHER): Payer: BLUE CROSS/BLUE SHIELD

## 2016-08-30 DIAGNOSIS — Z0000 Encounter for general adult medical examination without abnormal findings: Secondary | ICD-10-CM | POA: Diagnosis not present

## 2016-08-30 DIAGNOSIS — Z114 Encounter for screening for human immunodeficiency virus [HIV]: Secondary | ICD-10-CM

## 2016-08-30 DIAGNOSIS — Z09 Encounter for follow-up examination after completed treatment for conditions other than malignant neoplasm: Secondary | ICD-10-CM | POA: Insufficient documentation

## 2016-08-30 DIAGNOSIS — Z1159 Encounter for screening for other viral diseases: Secondary | ICD-10-CM | POA: Diagnosis not present

## 2016-08-30 LAB — LIPID PANEL
CHOL/HDL RATIO: 3
CHOLESTEROL: 185 mg/dL (ref 0–200)
HDL: 70.4 mg/dL (ref >39.00)
LDL CALC: 98 mg/dL (ref 0–99)
NONHDL: 114.82
Triglycerides: 82 mg/dL (ref 0.0–149.0)
VLDL: 16.4 mg/dL (ref 0.0–40.0)

## 2016-08-30 LAB — COMPREHENSIVE METABOLIC PANEL
ALBUMIN: 4.2 g/dL (ref 3.5–5.2)
ALT: 16 U/L (ref 0–35)
AST: 22 U/L (ref 0–37)
Alkaline Phosphatase: 48 U/L (ref 39–117)
BUN: 13 mg/dL (ref 6–23)
CHLORIDE: 103 meq/L (ref 96–112)
CO2: 27 meq/L (ref 19–32)
CREATININE: 0.72 mg/dL (ref 0.40–1.20)
Calcium: 10.3 mg/dL (ref 8.4–10.5)
GFR: 88.24 mL/min (ref >60.00)
GLUCOSE: 97 mg/dL (ref 70–99)
Potassium: 3.9 mEq/L (ref 3.5–5.1)
SODIUM: 137 meq/L (ref 135–145)
Total Bilirubin: 0.7 mg/dL (ref 0.2–1.2)
Total Protein: 6.8 g/dL (ref 6.0–8.3)

## 2016-08-30 LAB — HIV ANTIBODY (ROUTINE TESTING W REFLEX): HIV: NONREACTIVE

## 2016-08-30 LAB — CBC WITH DIFFERENTIAL/PLATELET
BASOS ABS: 0 10*3/uL (ref 0.0–0.1)
Basophils Relative: 0.1 % (ref 0.0–3.0)
EOS ABS: 0 10*3/uL (ref 0.0–0.7)
Eosinophils Relative: 0 % (ref 0.0–5.0)
HEMATOCRIT: 45.9 % (ref 36.0–46.0)
HEMOGLOBIN: 15.9 g/dL — AB (ref 12.0–15.0)
LYMPHS PCT: 31.5 % (ref 12.0–46.0)
Lymphs Abs: 2.4 10*3/uL (ref 0.7–4.0)
MCHC: 34.7 g/dL (ref 30.0–36.0)
MCV: 91 fl (ref 78.0–100.0)
Monocytes Absolute: 0.7 10*3/uL (ref 0.1–1.0)
Monocytes Relative: 8.7 % (ref 3.0–12.0)
Neutro Abs: 4.5 10*3/uL (ref 1.4–7.7)
Neutrophils Relative %: 59.7 % (ref 43.0–77.0)
Platelets: 199 10*3/uL (ref 150.0–400.0)
RBC: 5.04 Mil/uL (ref 3.87–5.11)
RDW: 12.7 % (ref 11.5–15.5)
WBC: 7.5 10*3/uL (ref 4.0–10.5)

## 2016-08-30 LAB — TSH: TSH: 2.29 u[IU]/mL (ref 0.35–4.50)

## 2016-08-30 LAB — HEPATITIS C ANTIBODY: HCV AB: NEGATIVE

## 2016-08-30 NOTE — Assessment & Plan Note (Signed)
Asthma, allergies: Well-controlled with antihistaminics, very rarely uses an inhaler. Anxiety: On Pristiq,controlled this time. We talk about possibly d/c the medication but things are doing okay. No change Neck pain: sees neurology, visits the chiropractor as needed. Last neurology visit 04/06/2016, they recommend conservative treatment. RTC one year

## 2016-09-26 ENCOUNTER — Telehealth: Payer: Self-pay | Admitting: Internal Medicine

## 2016-09-26 NOTE — Telephone Encounter (Signed)
[facility] Day - Client [facility] Patient Name: Tammy Long DOB: 09-08-1955 Initial Comment caller states she has been breaking out in hives at night time, and they settle down during the day. Nurse Assessment Nurse: Cox, RN, [place] Date/Time (Eastern Time): 09/26/2016 3:03:02 PM Confirm and document reason for call. If symptomatic, describe symptoms. ---Caller states she has been breaking out in hives. On Vacation last week, broke out 1-2 before vacation, nothing during vacation, then started again after she returned. Hives occur at night. Does the patient have any new or worsening symptoms? ---Yes Will a triage be completed? ---Yes Related visit to physician within the last 2 weeks? ---No Does the PT have any chronic conditions? (i.e. diabetes, asthma, etc.) ---Yes List chronic conditions. ---pain Is this a behavioral health or substance abuse call? ---No Guidelines Guideline Title Affirmed Question Affirmed Notes Hives [1] MODERATE-SEVERE hives persist (i.e., hives interfere with normal activities or work) AND [2] taking antihistamine (e.g., Benadryl, Claritin) > 24 hours Final Disposition User See Physician within 24 Hours Cox, RN, [place] Comments caller states she can come in Thursday, anytime Referrals REFERRED TO PCP OFFICE Disagree/Comply: Disagree Disagree/Comply Reason: Disagree with instructions Appt made for Thursday at 9:30 am with Dr. Larose Kells as this is the earliest she can make it.

## 2016-09-28 ENCOUNTER — Encounter: Payer: Self-pay | Admitting: Internal Medicine

## 2016-09-28 ENCOUNTER — Ambulatory Visit (INDEPENDENT_AMBULATORY_CARE_PROVIDER_SITE_OTHER): Payer: BLUE CROSS/BLUE SHIELD | Admitting: Internal Medicine

## 2016-09-28 VITALS — BP 116/68 | HR 77 | Temp 97.8°F | Resp 14 | Ht 62.0 in | Wt 119.4 lb

## 2016-09-28 DIAGNOSIS — L509 Urticaria, unspecified: Secondary | ICD-10-CM | POA: Diagnosis not present

## 2016-09-28 DIAGNOSIS — T4145XS Adverse effect of unspecified anesthetic, sequela: Secondary | ICD-10-CM

## 2016-09-28 DIAGNOSIS — T8859XS Other complications of anesthesia, sequela: Secondary | ICD-10-CM

## 2016-09-28 MED ORDER — RANITIDINE HCL 300 MG PO CAPS: 300.0000 mg | ORAL_CAPSULE | Freq: Every evening | ORAL | 1 refills | Status: DC

## 2016-09-28 MED ORDER — PREDNISONE 10 MG PO TABS: ORAL_TABLET | ORAL | 0 refills | Status: DC

## 2016-09-28 NOTE — Patient Instructions (Signed)
For urticaria  Zyrtec every morning  Over-the-counter Benadryl 1 tablet at night  For the next 2 weeks take ranitidine 300 mg one tablet every night  Take a round of steroids as prescribed  If you're not improving, your symptoms come back or you have severe symptoms (shortness of breath, tongue or lip swelling): Call or go to the ER.   Angioedema Angioedema is sudden swelling in the body. The swelling can happen in any part of the body. It often happens on the skin and causes itchy, bumpy patches (hives) to form. This condition may:  Happen only one time.  Happen more than one time. It may come back at random times.  Keep coming back for a number of years. Someday it may stop coming back.  Follow these instructions at home:  Take over-the-counter and prescription medicines only as told by your doctor.  If you were given medicines for emergency allergy treatment, always carry them with you.  Wear a medical bracelet as told by your doctor.  Avoid the things that cause your attacks (triggers).  If this condition was passed to you from your parents and you want to have kids, talk to your doctor. Your kids may also have this condition. Contact a doctor if:  You have another attack.  Your attacks happen more often, even after you take steps to prevent them.  This condition was passed to you by your parents and you want to have kids. Get help right away if:  Your mouth, tongue, or lips get very swollen.  You have trouble breathing.  You have trouble swallowing.  You pass out (faint). This information is not intended to replace advice given to you by your health care provider. Make sure you discuss any questions you have with your health care provider. Document Released: 01/25/2009 Document Revised: 09/08/2015 Document Reviewed: 08/17/2015 Elsevier Interactive Patient Education  2017 [employer].

## 2016-09-28 NOTE — Progress Notes (Signed)
Subjective:    Patient ID: Tammy Long, female    DOB: Mar 28, 1955, 61 y.o.   MRN: [id number]  DOS:  09/28/2016 Type of visit - description : acute Interval history: Two-week history of nocturnal symptoms: weals @ the lower stomach, inner legs and arms. + Pruritus . No symptoms during the daytime. Denies any recent illness, no change in diet or medications. She does take a number of supplements and one of them change to a tablet that contains chocolate. Taking Zyrtec and Benadryl.  Also concerned about upcoming colonoscopy. She has a history of complications from anesthesia.   Review of Systems No fever chills No tongue or lip swelling. Asthma is controlled, no recent cough or wheezing No nausea or vomiting. No recent sore throat.   Past Medical History:  Diagnosis Date  . Anesthesia complication    hard to wake up, amnesia   . Anxiety   . Asthma   . PONV (postoperative nausea and vomiting)     Past Surgical History:  Procedure Laterality Date  . ABDOMINAL HYSTERECTOMY  04/2011  . CESAREAN SECTION     x 2  . HERNIA REPAIR    . SALPINGOOPHORECTOMY  05/01/2011   Procedure: SALPINGO OOPHERECTOMY;  Surgeon: Maeola Sarah. Landry Mellow, MD;  Location: [facility];  Service: Gynecology;  Laterality: Bilateral;    Social History   Social History  . Marital status: Married    Spouse name: N/A  . Number of children: 2  . Years of education: N/A   Occupational History  . owns a business     Social History Main Topics  . Smoking status: Never Smoker  . Smokeless tobacco: Never Used  . Alcohol use No  . Drug use: No  . Sexual activity: Not on file   Other Topics Concern  . Not on file   Social History Narrative  . No narrative on file      Allergies as of 09/28/2016      Reactions   Influenza Vaccines    Aches, fevers x 3-4 days    Meperidine Hcl Rash      Medication List       Accurate as of 09/28/16 11:59 PM. Always use your most recent med list.            acetaminophen 500 MG tablet Commonly known as:  TYLENOL Take 500 mg by mouth as needed. Headache or pain   albuterol 108 (90 Base) MCG/ACT inhaler Commonly known as:  PROVENTIL HFA;VENTOLIN HFA Inhale 2 puffs into the lungs every 6 (six) hours as needed for wheezing.   BIOTIN PO Take 35,000 mcg by mouth daily.   BROMELAIN PO Take 450 mg by mouth 3 (three) times daily.   CALCIUM + D + K PO Take 1 tablet by mouth daily. Calcium, Vit D, Vit K  500mg /200units/80mcg   cetirizine 10 MG tablet Commonly known as:  ZYRTEC Take 10 mg by mouth daily.   desvenlafaxine 50 MG 24 hr tablet Commonly known as:  PRISTIQ Take 50 mg by mouth daily.   estradiol 0.05 MG/24HR patch Commonly known as:  VIVELLE-DOT Place 1 patch onto the skin 2 (two) times a week.   fluticasone 50 MCG/ACT nasal spray Commonly known as:  FLONASE Place 2 sprays into both nostrils daily.   predniSONE 10 MG tablet Commonly known as:  DELTASONE 3 tabs x 2 days, 2 tabs x 2 days, 1 tab x 2 days   ranitidine 300 MG capsule Commonly known as:  ZANTAC Take 1 capsule (300 mg total) by mouth every evening.   Turmeric 450 MG Caps Take 450 mg by mouth 3 (three) times daily.   vitamin C 1000 MG tablet Take 1,000 mg by mouth 3 (three) times daily.          Objective:   Physical Exam BP 116/68 (BP Location: Left Arm, Patient Position: Sitting, Cuff Size: Small)   Pulse 77   Temp 97.8 F (36.6 C) (Oral)   Resp 14   Ht 5\' 2"  (1.575 m)   Wt 119 lb 6 oz (54.1 kg)   LMP 04/03/2011   SpO2 97%   BMI 21.83 kg/m  General:   Well developed, well nourished . NAD.  HEENT:  Normocephalic . Face symmetric, atraumatic Lungs:  CTA B Normal respiratory effort, no intercostal retractions, no accessory muscle use. Heart: RRR,  no murmur.  No pretibial edema bilaterally  Skin: No rash Neurologic:  alert & oriented X3.  Speech normal, gait appropriate for age and unassisted Psych--  Cognition and judgment appear  intact.  Cooperative with normal attention span and concentration.  Behavior appropriate. No anxious or depressed appearing.      Assessment & Plan:   Assessment  Asthma, allergies Anxiety MSK, neck pain, upper back,  Dr Everette Rank, chiropractor; used to see  Dr. Len Childs, ortho;  PLAN Urticaria: Sx c/w urticaria without red flag symptoms such as tongue swelling, systemic sxs. Etiology unclear. It does happen at night, when she goes to bed. Environmental? Also, she takes a new supplemented that contains chocolate. Plan: Zyrtec every morning, Benadryl every night, try ranitidine 300 mg at nighttime for 2 weeks. Also steroids for 6 days. If not better, symptoms resurface or severe she is to let me know. See instructions. Consider allergist referral. Anesthesia complications: Has an upcoming colonoscopy, in the past she had complications from anesthesia essentially no waking up "for hours". I rec  to discuss these thoroughly with her endoscopies, they need to have an anesthetist on site were they do the procedure, consider doing it in the hospital setting, not the office. She will let me know if she needs more direction. Currently is scheduled to have the cscope  in [place].  Today, I spent more than  25  min with the patient: >50% of the time counseling regards her problems with previous anesthesia, discussing the need to talk with her endoscopist to be sure the facility she had the procedure has all the equipment necessary in case she has complications. Multiple questions answered to the best of my ability.

## 2016-09-28 NOTE — Progress Notes (Signed)
Pre visit review using our clinic review tool, if applicable. No additional management support is needed unless otherwise documented below in the visit note. 

## 2016-09-29 DIAGNOSIS — T8859XA Other complications of anesthesia, initial encounter: Secondary | ICD-10-CM | POA: Insufficient documentation

## 2016-09-29 DIAGNOSIS — T4145XA Adverse effect of unspecified anesthetic, initial encounter: Secondary | ICD-10-CM | POA: Insufficient documentation

## 2016-09-29 NOTE — Assessment & Plan Note (Signed)
Urticaria: Sx c/w urticaria without red flag symptoms such as tongue swelling, systemic sxs. Etiology unclear. It does happen at night, when she goes to bed. Environmental? Also, she takes a new supplemented that contains chocolate. Plan: Zyrtec every morning, Benadryl every night, try ranitidine 300 mg at nighttime for 2 weeks. Also steroids for 6 days. If not better, symptoms resurface or severe she is to let me know. See instructions. Consider allergist referral. Anesthesia complications: Has an upcoming colonoscopy, in the past she had complications from anesthesia essentially no waking up "for hours". I rec  to discuss these thoroughly with her endoscopies, they need to have an anesthetist on site were they do the procedure, consider doing it in the hospital setting, not the office. She will let me know if she needs more direction. Currently is scheduled to have the cscope  in [place].

## 2016-10-09 ENCOUNTER — Encounter: Payer: Self-pay | Admitting: Internal Medicine

## 2016-10-09 ENCOUNTER — Ambulatory Visit (INDEPENDENT_AMBULATORY_CARE_PROVIDER_SITE_OTHER): Payer: BLUE CROSS/BLUE SHIELD | Admitting: Internal Medicine

## 2016-10-09 VITALS — BP 118/74 | HR 65 | Temp 97.8°F | Resp 14 | Ht 62.0 in | Wt 119.2 lb

## 2016-10-09 DIAGNOSIS — L509 Urticaria, unspecified: Secondary | ICD-10-CM | POA: Diagnosis not present

## 2016-10-09 NOTE — Progress Notes (Signed)
Subjective:    Patient ID: Tammy Long, female    DOB: 03/21/1955, 61 y.o.   MRN: [id number]  DOS:  10/09/2016 Type of visit - description : acute Interval history: @ last office visit, she was DX with urticaria, Rx prednisone, symptoms dramatically decreased. After she finished prednisone, symptoms started to "creep back up". Symptoms are the same as previously described, mostly hives from the waist down at night.  Review of Systems No fever chills No difficulty breathing or wheezing. No cough No lip or tongue swelling.  Past Medical History:  Diagnosis Date  . Anesthesia complication    hard to wake up, amnesia   . Anxiety   . Asthma   . PONV (postoperative nausea and vomiting)     Past Surgical History:  Procedure Laterality Date  . ABDOMINAL HYSTERECTOMY  04/2011  . CESAREAN SECTION     x 2  . HERNIA REPAIR    . SALPINGOOPHORECTOMY  05/01/2011   Procedure: SALPINGO OOPHERECTOMY;  Surgeon: Maeola Sarah. Landry Mellow, MD;  Location: [facility];  Service: Gynecology;  Laterality: Bilateral;    Social History   Social History  . Marital status: Married    Spouse name: N/A  . Number of children: 2  . Years of education: N/A   Occupational History  . owns a business     Social History Main Topics  . Smoking status: Never Smoker  . Smokeless tobacco: Never Used  . Alcohol use No  . Drug use: No  . Sexual activity: Not on file   Other Topics Concern  . Not on file   Social History Narrative  . No narrative on file      Allergies as of 10/09/2016      Reactions   Influenza Vaccines    Aches, fevers x 3-4 days    Meperidine Hcl Rash      Medication List       Accurate as of 10/09/16 11:59 PM. Always use your most recent med list.          acetaminophen 500 MG tablet Commonly known as:  TYLENOL Take 500 mg by mouth as needed. Headache or pain   albuterol 108 (90 Base) MCG/ACT inhaler Commonly known as:  PROVENTIL HFA;VENTOLIN HFA Inhale 2 puffs into the  lungs every 6 (six) hours as needed for wheezing.   BIOTIN PO Take 35,000 mcg by mouth daily.   BROMELAIN PO Take 450 mg by mouth 3 (three) times daily.   CALCIUM + D + K PO Take 1 tablet by mouth daily. Calcium, Vit D, Vit K  500mg /200units/41mcg   cetirizine 10 MG tablet Commonly known as:  ZYRTEC Take 10 mg by mouth daily.   desvenlafaxine 50 MG 24 hr tablet Commonly known as:  PRISTIQ Take 50 mg by mouth daily.   estradiol 0.05 MG/24HR patch Commonly known as:  VIVELLE-DOT Place 1 patch onto the skin 2 (two) times a week.   fluticasone 50 MCG/ACT nasal spray Commonly known as:  FLONASE Place 2 sprays into both nostrils daily.   predniSONE 10 MG tablet Commonly known as:  DELTASONE 3 tabs x 2 days, 2 tabs x 2 days, 1 tab x 2 days   ranitidine 300 MG capsule Commonly known as:  ZANTAC Take 1 capsule (300 mg total) by mouth every evening.   Turmeric 450 MG Caps Take 450 mg by mouth 3 (three) times daily.   vitamin C 1000 MG tablet Take 1,000 mg by mouth 3 (three)  times daily.          Objective:   Physical Exam BP 118/74 (BP Location: Left Arm, Patient Position: Sitting, Cuff Size: Small)   Pulse 65   Temp 97.8 F (36.6 C) (Oral)   Resp 14   Ht 5\' 2"  (1.575 m)   Wt 119 lb 4 oz (54.1 kg)   LMP 04/03/2011   SpO2 98%   BMI 21.81 kg/m  General:   Well developed, well nourished . NAD.  Skin: Essentially normal Neurologic:  alert & oriented X3.  Speech normal, gait appropriate for age and unassisted Psych--  Cognition and judgment appear intact.  Cooperative with normal attention span and concentration.  Behavior appropriate. No anxious or depressed appearing.      Assessment & Plan:  Assessment  Asthma, allergies Anxiety MSK, neck pain, upper back,  Dr Everette Rank, chiropractor; used to see  Dr. Len Childs, ortho;  PLAN Urticaria: sx c/w urticaria, good response to prednisone however sxs are coming back. Again the etiology is not clear. We talk about  possibly doing another round of prednisone and see how she does versus going to an allergist. She prefers to see an allergist, she will call and make an appointment. In the meantime I prefer to avoid steroids unless symptoms are severe or increase. Continue Zyrtec, Benadryl and also re-start ranitidine   RTC prn and 08-2017 cpx

## 2016-10-09 NOTE — Progress Notes (Signed)
Pre visit review using our clinic review tool, if applicable. No additional management support is needed unless otherwise documented below in the visit note. 

## 2016-10-09 NOTE — Patient Instructions (Addendum)
Please see your allergist within the next 10 days.  Continue taking Zyrtec, Benadryl and also ranitidine  If you have severe symptoms, go to the ER or call for a prednisone prescription.  Next visit  by 08-2017

## 2016-10-10 NOTE — Assessment & Plan Note (Signed)
Urticaria: sx c/w urticaria, good response to prednisone however sxs are coming back. Again the etiology is not clear. We talk about possibly doing another round of prednisone and see how she does versus going to an allergist. She prefers to see an allergist, she will call and make an appointment. In the meantime I prefer to avoid steroids unless symptoms are severe or increase. Continue Zyrtec, Benadryl and also re-start ranitidine   RTC prn and 08-2017 cpx

## 2016-10-24 DIAGNOSIS — J4520 Mild intermittent asthma, uncomplicated: Secondary | ICD-10-CM | POA: Diagnosis not present

## 2016-10-24 DIAGNOSIS — J3081 Allergic rhinitis due to animal (cat) (dog) hair and dander: Secondary | ICD-10-CM | POA: Diagnosis not present

## 2016-10-24 DIAGNOSIS — J3089 Other allergic rhinitis: Secondary | ICD-10-CM | POA: Diagnosis not present

## 2016-10-24 DIAGNOSIS — L501 Idiopathic urticaria: Secondary | ICD-10-CM | POA: Diagnosis not present

## 2016-10-30 DIAGNOSIS — J3089 Other allergic rhinitis: Secondary | ICD-10-CM | POA: Diagnosis not present

## 2016-10-31 DIAGNOSIS — J3081 Allergic rhinitis due to animal (cat) (dog) hair and dander: Secondary | ICD-10-CM | POA: Diagnosis not present

## 2016-11-28 ENCOUNTER — Other Ambulatory Visit: Payer: Self-pay | Admitting: Internal Medicine

## 2016-11-28 DIAGNOSIS — Z90710 Acquired absence of both cervix and uterus: Secondary | ICD-10-CM | POA: Diagnosis not present

## 2016-11-28 DIAGNOSIS — Z01419 Encounter for gynecological examination (general) (routine) without abnormal findings: Secondary | ICD-10-CM | POA: Diagnosis not present

## 2016-11-28 DIAGNOSIS — A6004 Herpesviral vulvovaginitis: Secondary | ICD-10-CM | POA: Diagnosis not present

## 2016-11-28 DIAGNOSIS — Z79890 Hormone replacement therapy (postmenopausal): Secondary | ICD-10-CM | POA: Diagnosis not present

## 2017-08-31 ENCOUNTER — Encounter: Payer: BLUE CROSS/BLUE SHIELD | Admitting: Internal Medicine

## 2017-10-05 DIAGNOSIS — M542 Cervicalgia: Secondary | ICD-10-CM | POA: Diagnosis not present

## 2017-10-05 DIAGNOSIS — M50323 Other cervical disc degeneration at C6-C7 level: Secondary | ICD-10-CM | POA: Diagnosis not present

## 2017-10-05 DIAGNOSIS — M50322 Other cervical disc degeneration at C5-C6 level: Secondary | ICD-10-CM | POA: Diagnosis not present

## 2017-10-05 DIAGNOSIS — M9901 Segmental and somatic dysfunction of cervical region: Secondary | ICD-10-CM | POA: Diagnosis not present

## 2018-02-28 ENCOUNTER — Other Ambulatory Visit: Payer: Self-pay | Admitting: Internal Medicine

## 2018-02-28 NOTE — Telephone Encounter (Signed)
Copied from [place] [id number] [phone]. Topic: Quick Communication - Rx Refill/Question >> Feb 28, 2018  1:48 PM Keene Breath wrote: Medication: desvenlafaxine (PRISTIQ) 50 MG 24 hr tablet  Patient called to request a refill for the above medication.  Patient has also scheduled an appointment for 03/05/2018  Preferred Pharmacy (with phone number or street name): [facility] [place], [place] [phone] (Phone) [phone] (Fax)

## 2018-02-28 NOTE — Telephone Encounter (Signed)
Requested medication (s) are due for refill today: yes  Requested medication (s) are on the active medication list: yes  Last refill:  Last refilled by historical provider  Future visit scheduled: yes, 03/05/18  Notes to clinic:  Unable to refill per protocol. Medication previously filled by historical provider    Requested Prescriptions  Pending Prescriptions Disp Refills   desvenlafaxine (PRISTIQ) 50 MG 24 hr tablet      Sig: Take 1 tablet (50 mg total) by mouth daily.     Psychiatry: Antidepressants - SNRI - desvenlafaxine & venlafaxine Failed - 02/28/2018  1:55 PM      Failed - LDL in normal range and within 360 days    LDL Cholesterol  Date Value Ref Range Status  08/30/2016 98 0 - 99 mg/dL Final         Failed - Total Cholesterol in normal range and within 360 days    Cholesterol  Date Value Ref Range Status  08/30/2016 185 0 - 200 mg/dL Final    Comment:    ATP III Classification       Desirable:  < 200 mg/dL               Borderline High:  200 - 239 mg/dL          High:  > = 240 mg/dL         Failed - Triglycerides in normal range and within 360 days    Triglycerides  Date Value Ref Range Status  08/30/2016 82.0 0.0 - 149.0 mg/dL Final    Comment:    Normal:  <150 mg/dLBorderline High:  150 - 199 mg/dL         Failed - Valid encounter within last 6 months    Recent Outpatient Visits          1 year ago [facility] [place], MD   1 year ago [place] [facility] [place], MD   1 year ago Annual physical exam   [facility] [place] [facility], MD   2 years ago Generalized abdominal pain   [facility] [place], [place]   3 years ago Abdominal pain, chronic, epigastric   [facility] [place], PA-C      Future Appointments        In 5 days [place], MD [facility] [place] BP in normal range    BP Readings from Last 1 Encounters:  10/09/16 118/74

## 2018-03-01 MED ORDER — DESVENLAFAXINE SUCCINATE ER 50 MG PO TB24: 50.0000 mg | ORAL_TABLET | Freq: Every day | ORAL | 0 refills | Status: DC

## 2018-03-01 NOTE — Telephone Encounter (Signed)
Do not see where you have ever filled Pristiq for Pt before. She has an appt scheduled 03/05/2018. Please advise.

## 2018-03-01 NOTE — Telephone Encounter (Signed)
Ok to [employer] x 1 month, will discuss further Rx on RTC

## 2018-03-05 ENCOUNTER — Other Ambulatory Visit: Payer: BLUE CROSS/BLUE SHIELD

## 2018-03-05 ENCOUNTER — Ambulatory Visit (INDEPENDENT_AMBULATORY_CARE_PROVIDER_SITE_OTHER): Payer: BLUE CROSS/BLUE SHIELD | Admitting: Internal Medicine

## 2018-03-05 ENCOUNTER — Encounter: Payer: Self-pay | Admitting: Internal Medicine

## 2018-03-05 ENCOUNTER — Telehealth: Payer: Self-pay | Admitting: Internal Medicine

## 2018-03-05 VITALS — BP 116/72 | HR 71 | Temp 98.1°F | Resp 16 | Ht 62.0 in | Wt 128.1 lb

## 2018-03-05 DIAGNOSIS — Z0000 Encounter for general adult medical examination without abnormal findings: Secondary | ICD-10-CM

## 2018-03-05 DIAGNOSIS — G8929 Other chronic pain: Secondary | ICD-10-CM

## 2018-03-05 DIAGNOSIS — R1013 Epigastric pain: Secondary | ICD-10-CM | POA: Diagnosis not present

## 2018-03-05 DIAGNOSIS — Z1211 Encounter for screening for malignant neoplasm of colon: Secondary | ICD-10-CM

## 2018-03-05 DIAGNOSIS — D751 Secondary polycythemia: Secondary | ICD-10-CM

## 2018-03-05 DIAGNOSIS — E875 Hyperkalemia: Secondary | ICD-10-CM

## 2018-03-05 DIAGNOSIS — M542 Cervicalgia: Secondary | ICD-10-CM

## 2018-03-05 LAB — CBC WITH DIFFERENTIAL/PLATELET
BASOS PCT: 1.1 % (ref 0.0–3.0)
Basophils Absolute: 0.1 10*3/uL (ref 0.0–0.1)
EOS ABS: 0.5 10*3/uL (ref 0.0–0.7)
EOS PCT: 8.9 % — AB (ref 0.0–5.0)
HEMATOCRIT: 47.7 % — AB (ref 36.0–46.0)
HEMOGLOBIN: 16.1 g/dL — AB (ref 12.0–15.0)
Lymphocytes Relative: 37.1 % (ref 12.0–46.0)
Lymphs Abs: 2.1 10*3/uL (ref 0.7–4.0)
MCHC: 33.7 g/dL (ref 30.0–36.0)
MCV: 92.2 fl (ref 78.0–100.0)
MONO ABS: 0.5 10*3/uL (ref 0.1–1.0)
Monocytes Relative: 8.7 % (ref 3.0–12.0)
NEUTROS ABS: 2.5 10*3/uL (ref 1.4–7.7)
Neutrophils Relative %: 44.2 % (ref 43.0–77.0)
PLATELETS: 206 10*3/uL (ref 150.0–400.0)
RBC: 5.17 Mil/uL — ABNORMAL HIGH (ref 3.87–5.11)
RDW: 13 % (ref 11.5–15.5)
WBC: 5.6 10*3/uL (ref 4.0–10.5)

## 2018-03-05 LAB — TSH: TSH: 1.46 u[IU]/mL (ref 0.35–4.50)

## 2018-03-05 LAB — COMPREHENSIVE METABOLIC PANEL
ALK PHOS: 55 U/L (ref 39–117)
ALT: 11 U/L (ref 0–35)
AST: 18 U/L (ref 0–37)
Albumin: 4.5 g/dL (ref 3.5–5.2)
BUN: 11 mg/dL (ref 6–23)
CO2: 28 mEq/L (ref 19–32)
Calcium: 10.7 mg/dL — ABNORMAL HIGH (ref 8.4–10.5)
Chloride: 106 mEq/L (ref 96–112)
Creatinine, Ser: 0.79 mg/dL (ref 0.40–1.20)
GFR: 78.87 mL/min (ref >60.00)
Glucose, Bld: 115 mg/dL — ABNORMAL HIGH (ref 70–99)
Potassium: 5.9 mEq/L — ABNORMAL HIGH (ref 3.5–5.1)
Sodium: 142 mEq/L (ref 135–145)
TOTAL PROTEIN: 6.8 g/dL (ref 6.0–8.3)
Total Bilirubin: 0.8 mg/dL (ref 0.2–1.2)

## 2018-03-05 LAB — LIPID PANEL
CHOLESTEROL: 212 mg/dL — AB (ref 0–200)
HDL: 73.6 mg/dL (ref >39.00)
LDL CALC: 126 mg/dL — AB (ref 0–99)
NonHDL: 138.09
TRIGLYCERIDES: 61 mg/dL (ref 0.0–149.0)
Total CHOL/HDL Ratio: 3
VLDL: 12.2 mg/dL (ref 0.0–40.0)

## 2018-03-05 MED ORDER — DESVENLAFAXINE SUCCINATE ER 50 MG PO TB24: 50.0000 mg | ORAL_TABLET | Freq: Every day | ORAL | 3 refills | Status: AC

## 2018-03-05 MED ORDER — PANTOPRAZOLE SODIUM 40 MG PO TBEC: 40.0000 mg | DELAYED_RELEASE_TABLET | Freq: Every day | ORAL | 3 refills | Status: AC

## 2018-03-05 MED ORDER — ZOSTER VAC RECOMB ADJUVANTED 50 MCG/0.5ML IM SUSR: 0.5000 mL | Freq: Once | INTRAMUSCULAR | 1 refills | Status: AC

## 2018-03-05 NOTE — Progress Notes (Signed)
Pre visit review using our clinic review tool, if applicable. No additional management support is needed unless otherwise documented below in the visit note. 

## 2018-03-05 NOTE — Telephone Encounter (Addendum)
Called patient Potassium elevated, creatinine normal, no taking any supplements, denies chest pain or palpitations. Plan:  --Will come back tomorrow at 8, will check stat BMP.  Will also get EKG. --Also hemoglobin is a slightly elevated (polycythemia), we need to check a EPO a level

## 2018-03-05 NOTE — Assessment & Plan Note (Signed)
-  Td 08-2016; had a reaction to last flu shot ("stay in bed x 1 week: fever, N-V", no itching ), very reluctant to have a flu shot; shinglex rx printed again today - [facility]: never had cscope, she again requests a GI referral, [facility] in [place],  she has h/o a severe anesthesia reaction , recommend to discuss with GI the issue with anesthesia prior to the procedure -female care: per [facility] ; had a dexa before @ gyn, we agreed she will discuss w/ gyn - labs: CMP, FLP, CBC, TSH -diet, exercise : reports is doing well

## 2018-03-05 NOTE — Assessment & Plan Note (Signed)
Asthma: Well-controlled, uses albuterol on average 4-5 times a month, okay to use asthma preexposure (usually developed cough and wheezing when he visit her in-laws) Anxiety: Seems in proportion to what is going on her life (has a new job, sold her house, etc.).  No red flag symptoms, she will call for counseling or medications if needed.  She does take Pristiq for a different reason. Neck pain: Has been on Pristiq for a while, used to be prescribed by neurology, request a prescription from me.  Will do Urticaria: Reports that she has seen an allergist Abdominal pain: As described above, etiology unclear. No red flags.  We will get a ultrasound of the abdomen start Protonix x4 weeks.  If not better she knows to come back in 3 months or discussed with the gastroenterologist we are referring her to for a colonoscopy. RTC 3 months continue with abdominal pain otherwise 1 year CPX

## 2018-03-05 NOTE — Progress Notes (Signed)
Subjective:    Patient ID: Tammy Long, female    DOB: 1955/06/08, 63 y.o.   MRN: [id number]  DOS:  03/05/2018 Type of visit - description: CPX Has also all her concerns   Review of Systems Anxiety, related to selling her house, moving from this area to [facility], daughter in college, etc.  Symptoms are not overwhelming, no insomnia. Also 1 year history of occasional epigastric pain described as discomfort, not burning.  Not necessarily associated with food. No radiation No associated nausea, vomiting, fever, weight loss, blood in the stools. Has episodically taking Pepto-Bismol and Tums, help?  Other than above, a 14 point review of systems is negative    Past Medical History:  Diagnosis Date  . Anesthesia complication    hard to wake up, amnesia   . Anxiety   . Asthma   . PONV (postoperative nausea and vomiting)     Past Surgical History:  Procedure Laterality Date  . ABDOMINAL HYSTERECTOMY  04/2011  . CESAREAN SECTION     x 2  . HERNIA REPAIR    . SALPINGOOPHORECTOMY  05/01/2011   Procedure: SALPINGO OOPHERECTOMY;  Surgeon: Maeola Sarah. Landry Mellow, MD;  Location: [facility];  Service: Gynecology;  Laterality: Bilateral;    Social History   Socioeconomic History  . Marital status: Married    Spouse name: Not on file  . Number of children: 2  . Years of education: Not on file  . Highest education level: Not on file  Occupational History  . Occupation: owns a Teaching laboratory technician  . Financial resource strain: Not on file  . Food insecurity:    Worry: Not on file    Inability: Not on file  . Transportation needs:    Medical: Not on file    Non-medical: Not on file  Tobacco Use  . Smoking status: Never Smoker  . Smokeless tobacco: Never Used  Substance and Sexual Activity  . Alcohol use: No  . Drug use: No  . Sexual activity: Not on file  Lifestyle  . Physical activity:    Days per week: Not on file    Minutes per session: Not on file  . Stress:  Not on file  Relationships  . Social connections:    Talks on phone: Not on file    Gets together: Not on file    Attends religious service: Not on file    Active member of club or organization: Not on file    Attends meetings of clubs or organizations: Not on file    Relationship status: Not on file  . Intimate partner violence:    Fear of current or ex partner: Not on file    Emotionally abused: Not on file    Physically abused: Not on file    Forced sexual activity: Not on file  Other Topics Concern  . Not on file  Social History Narrative   Moved to [place] 2019     Family History  Problem Relation Age of Onset  . CAD Father        MI x 2, first at ge 67  . Diabetes Neg Hx   . Breast cancer Neg Hx   . Colon cancer Neg Hx      Allergies as of 03/05/2018      Reactions   Influenza Vaccines    Aches, fevers x 3-4 days    Meperidine Hcl Rash      Medication List  Accurate as of March 05, 2018  6:26 PM. Always use your most recent med list.        albuterol 108 (90 Base) MCG/ACT inhaler Commonly known as:  PROVENTIL HFA;VENTOLIN HFA Inhale 2 puffs into the lungs every 6 (six) hours as needed for wheezing.   BIOTIN PO Take 35,000 mcg by mouth daily.   CALCIUM + D + K PO Take 1 tablet by mouth daily. Calcium, Vit D, Vit K  500mg /200units/57mcg   cetirizine 10 MG tablet Commonly known as:  ZYRTEC Take 10 mg by mouth daily.   desvenlafaxine 50 MG 24 hr tablet Commonly known as:  PRISTIQ Take 1 tablet (50 mg total) by mouth daily.   estradiol 0.05 MG/24HR patch Commonly known as:  VIVELLE-DOT Place 1 patch onto the skin 2 (two) times a week.   fluticasone 50 MCG/ACT nasal spray Commonly known as:  FLONASE Place 2 sprays into both nostrils daily.   pantoprazole 40 MG tablet Commonly known as:  PROTONIX Take 1 tablet (40 mg total) by mouth daily before breakfast.   vitamin C 1000 MG tablet Take 1,000 mg by mouth 3 (three) times daily.     Zoster Vaccine Adjuvanted injection Commonly known as:  SHINGRIX Inject 0.5 mLs into the muscle once for 1 dose.           Objective:   Physical Exam BP 116/72 (BP Location: Left Arm, Patient Position: Sitting, Cuff Size: Small)   Pulse 71   Temp 98.1 F (36.7 C) (Oral)   Resp 16   Ht 5\' 2"  (1.575 m)   Wt 128 lb 2 oz (58.1 kg)   LMP 04/03/2011   SpO2 96%   BMI 23.43 kg/m  General: Well developed, NAD, BMI noted Neck: No  thyromegaly  HEENT:  Normocephalic . Face symmetric, atraumatic Lungs:  CTA B Normal respiratory effort, no intercostal retractions, no accessory muscle use. Heart: RRR,  no murmur.  No pretibial edema bilaterally  Abdomen:  Not distended, soft, non-tender. No rebound or rigidity.   Skin: Exposed areas without rash. Not pale. Not jaundice Neurologic:  alert & oriented X3.  Speech normal, gait appropriate for age and unassisted Strength symmetric and appropriate for age.  Psych: Cognition and judgment appear intact.  Cooperative with normal attention span and concentration.  Behavior appropriate. No anxious or depressed appearing.     Assessment     Assessment  Asthma, allergies Anxiety MSK, neck pain, upper back,  Dr Everette Rank, chiropractor; used to see  Dr. Len Childs, ortho; prescribed Pristiq by neurology for neck pain, PCP assumed RX 02-2018  PLAN Asthma: Well-controlled, uses albuterol on average 4-5 times a month, okay to use asthma preexposure (usually developed cough and wheezing when he visit her in-laws) Anxiety: Seems in proportion to what is going on her life (has a new job, sold her house, etc.).  No red flag symptoms, she will call for counseling or medications if needed.  She does take Pristiq for a different reason. Neck pain: Has been on Pristiq for a while, used to be prescribed by neurology, request a prescription from me.  Will do Urticaria: Reports that she has seen an allergist Abdominal pain: As described above, etiology  unclear. No red flags.  We will get a ultrasound of the abdomen start Protonix x4 weeks.  If not better she knows to come back in 3 months or discussed with the gastroenterologist we are referring her to for a colonoscopy. RTC 3 months continue with abdominal pain  otherwise 1 year CPX   Today, in addition to her physical exam I spent more than 20 min with the patient: >50% of the time counseling regards asthma, anxiety, neck pain, assessing new problem: Abdominal pain.

## 2018-03-05 NOTE — Patient Instructions (Signed)
GO TO THE LAB : Get the blood work     GO TO THE FRONT DESK Schedule your next appointment for a checkup in 3 months if your stomach does not improve otherwise 1 year for physical exam  For stomach pain: Please proceed with your ultrasound Take Protonix 40 mg 1 tablet before breakfast for 4 weeks, then as needed If you are not better please let me know Also, discuss your upper abdominal pain with a gastroenterologist who will do the colonoscopy.

## 2018-03-05 NOTE — Telephone Encounter (Signed)
Pt coming tomorrow at 8am for EKG and labs.

## 2018-03-06 ENCOUNTER — Other Ambulatory Visit (INDEPENDENT_AMBULATORY_CARE_PROVIDER_SITE_OTHER): Payer: BLUE CROSS/BLUE SHIELD

## 2018-03-06 ENCOUNTER — Ambulatory Visit: Payer: BLUE CROSS/BLUE SHIELD | Admitting: Internal Medicine

## 2018-03-06 DIAGNOSIS — D751 Secondary polycythemia: Secondary | ICD-10-CM

## 2018-03-06 DIAGNOSIS — E875 Hyperkalemia: Secondary | ICD-10-CM

## 2018-03-06 LAB — BASIC METABOLIC PANEL
BUN: 15 mg/dL (ref 6–23)
CO2: 30 mEq/L (ref 19–32)
Calcium: 10.4 mg/dL (ref 8.4–10.5)
Chloride: 102 mEq/L (ref 96–112)
Creatinine, Ser: 0.76 mg/dL (ref 0.40–1.20)
GFR: 82.48 mL/min (ref >60.00)
Glucose, Bld: 110 mg/dL — ABNORMAL HIGH (ref 70–99)
Potassium: 4.3 mEq/L (ref 3.5–5.1)
Sodium: 138 mEq/L (ref 135–145)

## 2018-03-06 NOTE — Progress Notes (Signed)
Pre visit review using our clinic review tool, if applicable. No additional management support is needed unless otherwise documented below in the visit note.  Pt here for EKG and repeat labs. Potassium elevated yesterday 03/06/2018. 5.9!

## 2018-03-06 NOTE — Progress Notes (Signed)
Patient here at my request Potassium was very high, the patient feels well, BMP stopped with no tourniquet sent. She has been drinking plenty of coconut water which may be high in potassium.  Recommend to stop EKG today normal. All other labs discussed, and they look good except for slightly elevated hemoglobin; she is not obese, does not snore, has well controlled asthma.  EPO sent. Kathlene November, MD

## 2018-03-08 LAB — ERYTHROPOIETIN: Erythropoietin: 10 m[IU]/mL (ref 2.6–18.5)

## 2018-03-18 ENCOUNTER — Other Ambulatory Visit (HOSPITAL_BASED_OUTPATIENT_CLINIC_OR_DEPARTMENT_OTHER): Payer: BLUE CROSS/BLUE SHIELD

## 2018-03-18 DIAGNOSIS — Z01419 Encounter for gynecological examination (general) (routine) without abnormal findings: Secondary | ICD-10-CM | POA: Diagnosis not present

## 2018-03-18 DIAGNOSIS — A6004 Herpesviral vulvovaginitis: Secondary | ICD-10-CM | POA: Diagnosis not present

## 2018-03-18 DIAGNOSIS — Z90710 Acquired absence of both cervix and uterus: Secondary | ICD-10-CM | POA: Diagnosis not present

## 2018-03-18 DIAGNOSIS — Z79890 Hormone replacement therapy (postmenopausal): Secondary | ICD-10-CM | POA: Diagnosis not present

## 2018-03-20 NOTE — Addendum Note (Signed)
Addended byDamita Dunnings D on: 03/20/2018 08:35 AM   Modules accepted: Orders

## 2018-03-22 ENCOUNTER — Encounter: Payer: Self-pay | Admitting: Internal Medicine

## 2018-04-01 DIAGNOSIS — Z1211 Encounter for screening for malignant neoplasm of colon: Secondary | ICD-10-CM | POA: Diagnosis not present

## 2018-04-01 LAB — HM COLONOSCOPY

## 2018-04-03 IMAGING — US US ABDOMEN COMPLETE
1 series · 14 of 25 positions shown · non-contrast
Comparison: None.

CLINICAL DATA: Generalized abdominal pain for 3 weeks.

EXAM:
ABDOMEN ULTRASOUND COMPLETE

[Series 1: us abdomen complete · 0.12mm/px · 14 of 75 slices shown]
[im 1/75]
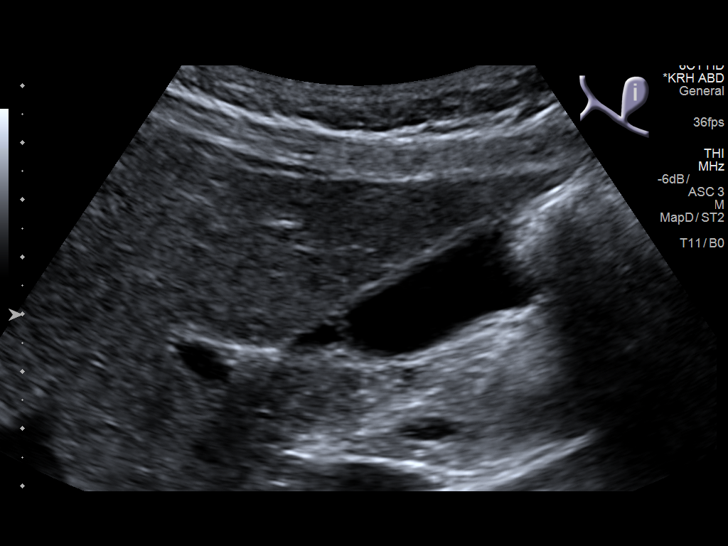
[im 7/75]
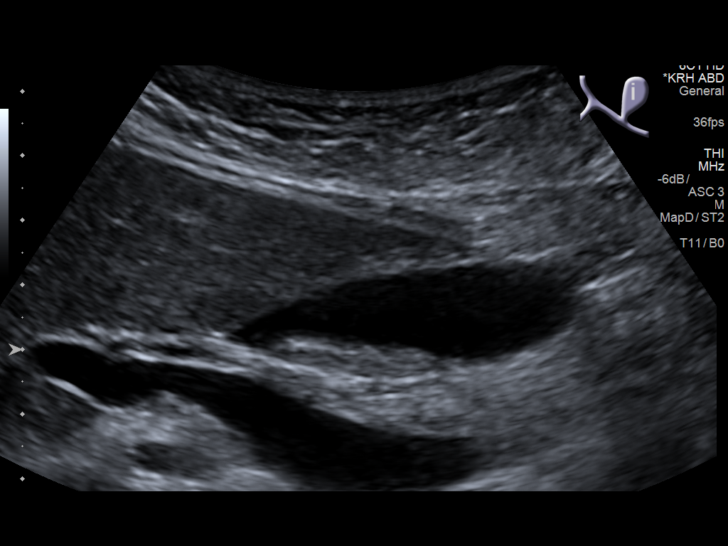
[im 13/75]
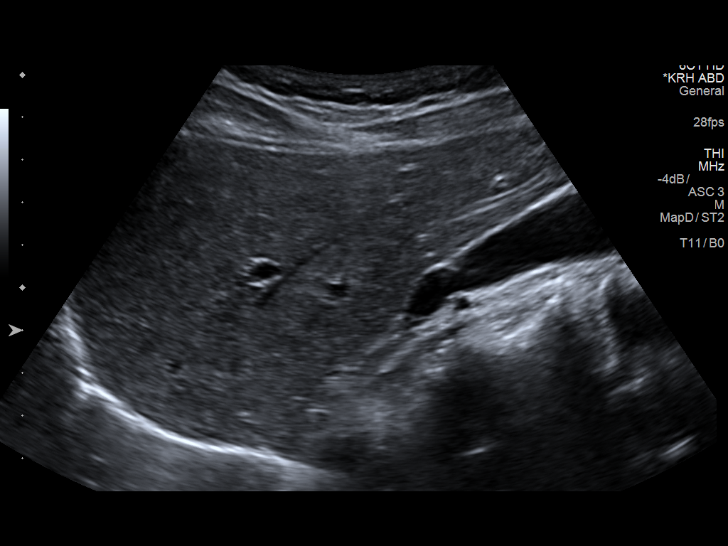
[im 19/75]
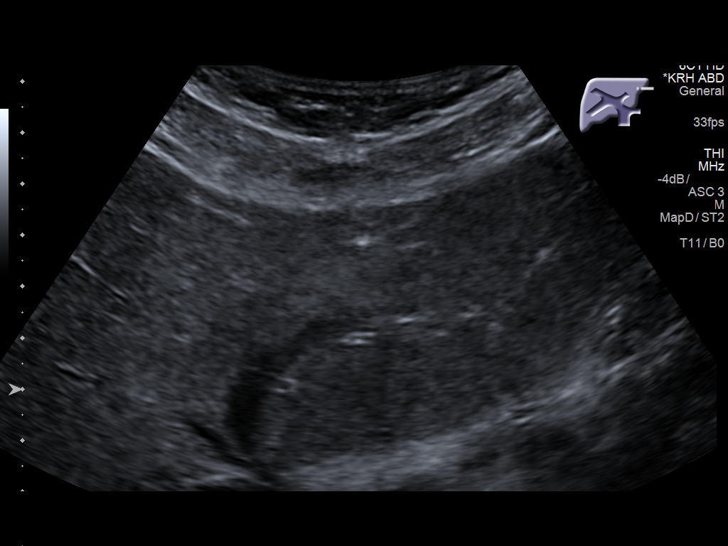
[im 25/75]
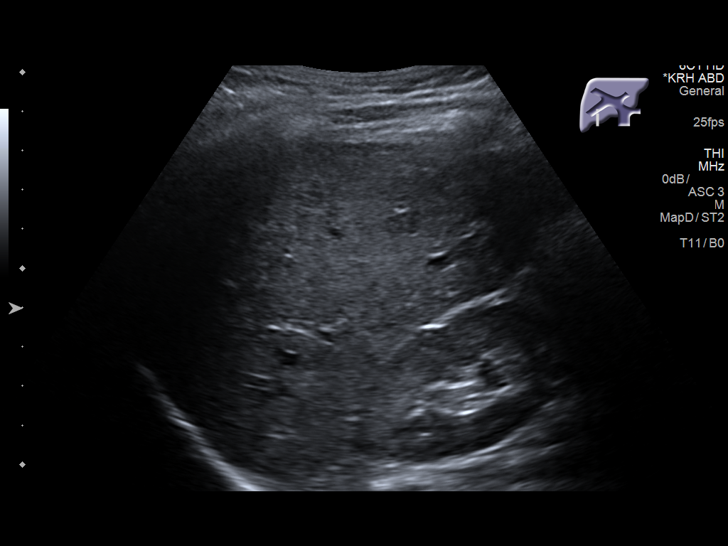
[im 28/75]
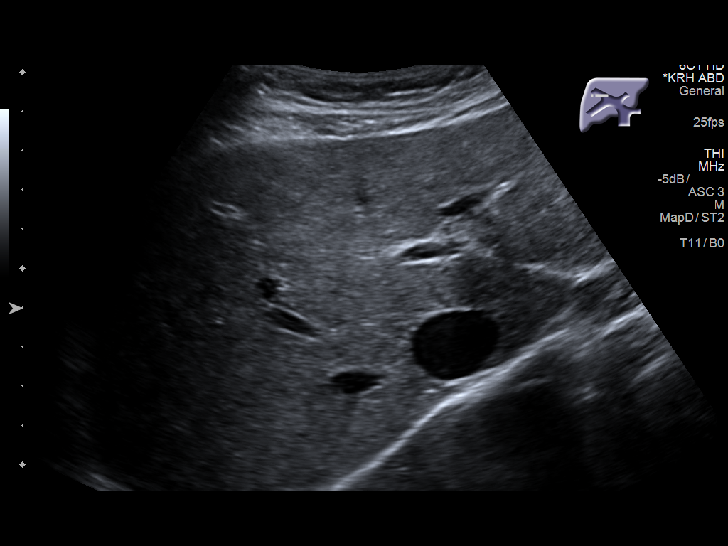
[im 34/75]
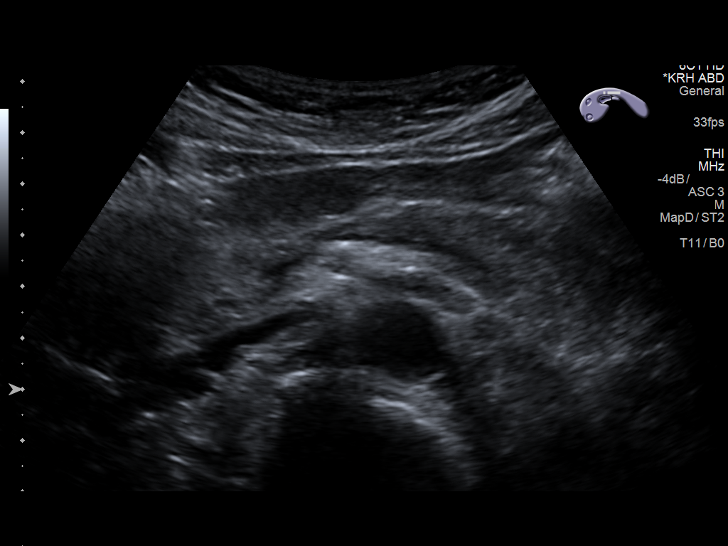
[im 41/75]
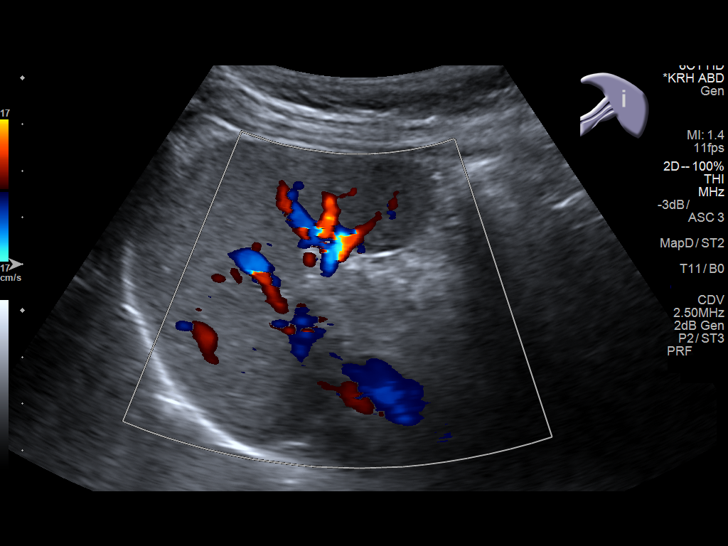
[im 47/75]
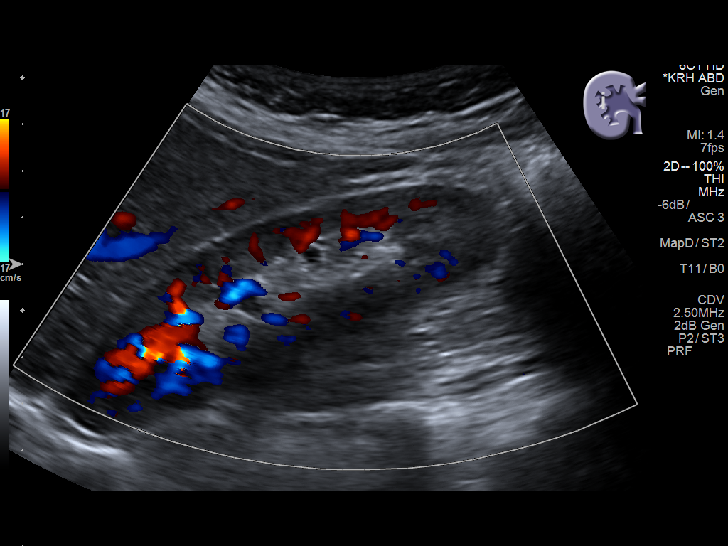
[im 50/75]
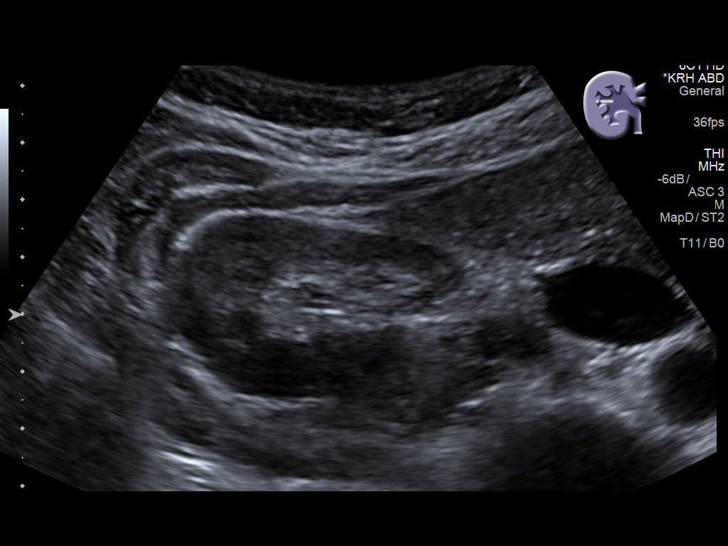
[im 56/75]
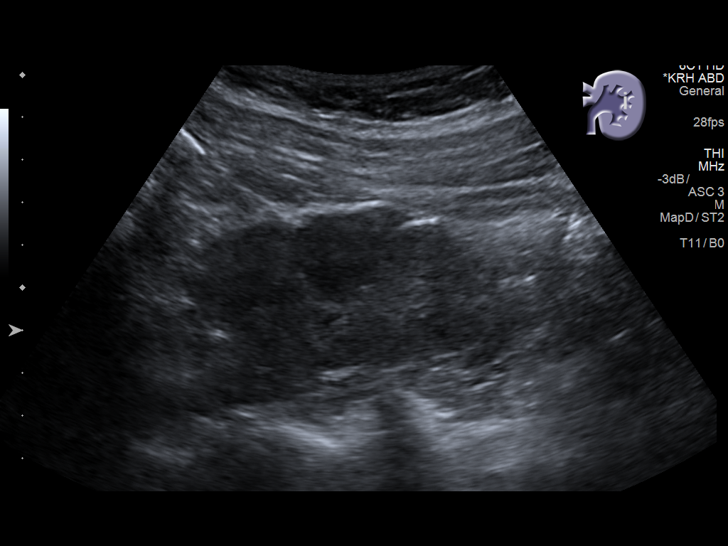
[im 62/75]
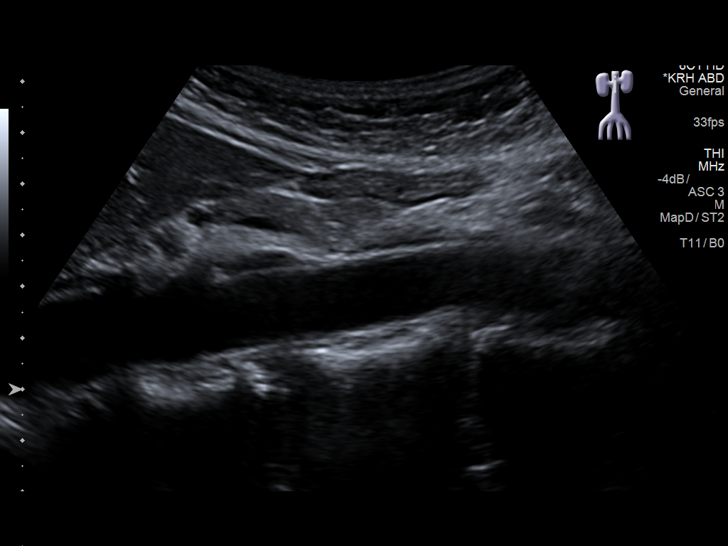
[im 68/75]
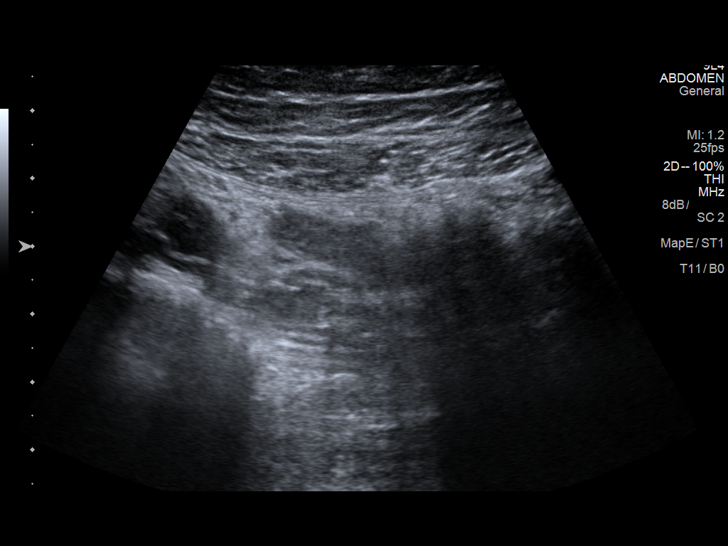
[im 75/75]
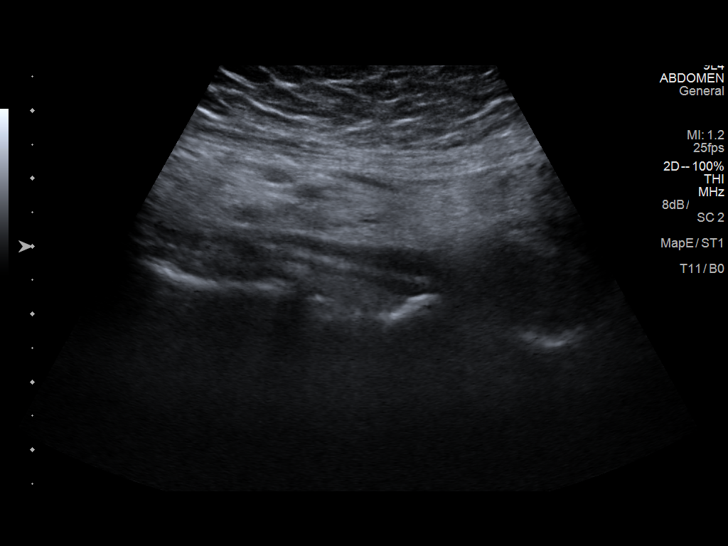

[14 of 25 positions shown; findings below may reference images not displayed]

FINDINGS: Gallbladder: No gallstones or wall thickening visualized. No
sonographic Murphy sign noted by sonographer.

Common bile duct: Diameter: 2 mm which is within normal limits.

Liver: No focal lesion identified. Within normal limits in
parenchymal echogenicity.

IVC: No abnormality visualized.

Pancreas: Visualized portion unremarkable.

Spleen: Size and appearance within normal limits.

Right Kidney: Length: 10.3 cm. Echogenicity within normal limits. No
mass or hydronephrosis visualized.

Left Kidney: Length: 9.7 cm. Echogenicity within normal limits. No
mass or hydronephrosis visualized.

Abdominal aorta: No aneurysm visualized.

Other findings: No definite abnormality seen in the periumbilical
region.
IMPRESSION: No definite abnormality seen in the abdomen.

## 2018-04-08 ENCOUNTER — Encounter: Payer: Self-pay | Admitting: Internal Medicine

## 2018-04-11 ENCOUNTER — Encounter: Payer: Self-pay | Admitting: Internal Medicine

## 2018-04-24 ENCOUNTER — Telehealth: Payer: Self-pay

## 2018-04-24 NOTE — Telephone Encounter (Signed)
Copied from [place] [id number]. Topic: Referral - Status >> Apr 24, 2018  8:49 AM Antonieta Iba C wrote: Reason for CRM: pt called in to check the status of her referral. Pt says at a recent ov she was advised by PCP that  she needed a referral to see a specialist due to her hemoglobin being low. Pt says that she has not heard anything else about it. Pt would like further assistance with this.   Please advise.

## 2018-04-24 NOTE — Telephone Encounter (Signed)
Can you check status of hematology referral that was placed on 03/20/2018 please?

## 2018-04-24 NOTE — Telephone Encounter (Signed)
Spoke with [facility] stated they were dialing the wrong number, pt has been scheduled for 3/30, pt will be notified today regarding appt.

## 2018-05-01 DIAGNOSIS — Z1231 Encounter for screening mammogram for malignant neoplasm of breast: Secondary | ICD-10-CM | POA: Diagnosis not present

## 2018-12-16 DIAGNOSIS — M5010 Cervical disc disorder with radiculopathy, unspecified cervical region: Secondary | ICD-10-CM | POA: Diagnosis not present

## 2018-12-16 DIAGNOSIS — J4520 Mild intermittent asthma, uncomplicated: Secondary | ICD-10-CM | POA: Diagnosis not present

## 2018-12-16 DIAGNOSIS — M4802 Spinal stenosis, cervical region: Secondary | ICD-10-CM | POA: Diagnosis not present

## 2018-12-16 DIAGNOSIS — Z23 Encounter for immunization: Secondary | ICD-10-CM | POA: Diagnosis not present

## 2018-12-16 DIAGNOSIS — M5432 Sciatica, left side: Secondary | ICD-10-CM | POA: Diagnosis not present

## 2019-03-10 ENCOUNTER — Encounter: Payer: BLUE CROSS/BLUE SHIELD | Admitting: Internal Medicine

## 2019-04-13 ENCOUNTER — Other Ambulatory Visit: Payer: Self-pay | Admitting: Internal Medicine
